# Patient Record
Sex: Male | Born: 1935
Health system: Southern US, Community
[De-identification: ages and names within clinical notes are randomized; demographics above are authoritative.]

## PROBLEM LIST (undated history)

## (undated) DIAGNOSIS — N5201 Erectile dysfunction due to arterial insufficiency: Secondary | ICD-10-CM

## (undated) DIAGNOSIS — Z7901 Long term (current) use of anticoagulants: Secondary | ICD-10-CM

## (undated) DIAGNOSIS — N402 Nodular prostate without lower urinary tract symptoms: Secondary | ICD-10-CM

## (undated) DIAGNOSIS — R1319 Other dysphagia: Secondary | ICD-10-CM

## (undated) DIAGNOSIS — I480 Paroxysmal atrial fibrillation: Secondary | ICD-10-CM

## (undated) DIAGNOSIS — E039 Hypothyroidism, unspecified: Secondary | ICD-10-CM

## (undated) DIAGNOSIS — M5412 Radiculopathy, cervical region: Secondary | ICD-10-CM

## (undated) DIAGNOSIS — R1310 Dysphagia, unspecified: Secondary | ICD-10-CM

## (undated) DIAGNOSIS — K219 Gastro-esophageal reflux disease without esophagitis: Secondary | ICD-10-CM

## (undated) DIAGNOSIS — G5602 Carpal tunnel syndrome, left upper limb: Secondary | ICD-10-CM

## (undated) DIAGNOSIS — I10 Essential (primary) hypertension: Secondary | ICD-10-CM

## (undated) DIAGNOSIS — M5430 Sciatica, unspecified side: Secondary | ICD-10-CM

## (undated) DIAGNOSIS — M1990 Unspecified osteoarthritis, unspecified site: Secondary | ICD-10-CM

## (undated) DIAGNOSIS — A809 Acute poliomyelitis, unspecified: Secondary | ICD-10-CM

## (undated) HISTORY — DX: Paroxysmal atrial fibrillation: I48.0

## (undated) HISTORY — DX: Unspecified osteoarthritis, unspecified site: M19.90

## (undated) HISTORY — DX: Radiculopathy, cervical region: M54.12

## (undated) HISTORY — DX: Sciatica, unspecified side: M54.30

## (undated) HISTORY — PX: STAPEDES SURGERY: SHX789

## (undated) HISTORY — DX: Nodular prostate without lower urinary tract symptoms: N40.2

## (undated) HISTORY — PX: VASECTOMY: SHX75

## (undated) HISTORY — DX: Acute poliomyelitis, unspecified: A80.9

## (undated) HISTORY — DX: Erectile dysfunction due to arterial insufficiency: N52.01

## (undated) HISTORY — DX: Gastro-esophageal reflux disease without esophagitis: K21.9

## (undated) HISTORY — DX: Other dysphagia: R13.19

## (undated) HISTORY — DX: Carpal tunnel syndrome, left upper limb: G56.02

## (undated) HISTORY — DX: Hypothyroidism, unspecified: E03.9

## (undated) HISTORY — DX: Long term (current) use of anticoagulants: Z79.01

## (undated) HISTORY — PX: KNEE SURGERY: SHX244

## (undated) HISTORY — DX: Essential (primary) hypertension: I10

## (undated) HISTORY — PX: BACK SURGERY: SHX140

---
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
## 1997-09-14 HISTORY — PX: FLEXIBLE SIGMOIDOSCOPY: SHX1649

## 2010-12-08 HISTORY — PX: COLONOSCOPY: SHX174

## 2014-08-16 DIAGNOSIS — M5412 Radiculopathy, cervical region: Secondary | ICD-10-CM

## 2014-08-16 HISTORY — DX: Radiculopathy, cervical region: M54.12

## 2014-08-28 DIAGNOSIS — G5602 Carpal tunnel syndrome, left upper limb: Secondary | ICD-10-CM

## 2014-08-28 HISTORY — DX: Carpal tunnel syndrome, left upper limb: G56.02

## 2015-06-25 DIAGNOSIS — K210 Gastro-esophageal reflux disease with esophagitis: Secondary | ICD-10-CM | POA: Diagnosis not present

## 2015-06-25 DIAGNOSIS — R1314 Dysphagia, pharyngoesophageal phase: Secondary | ICD-10-CM | POA: Diagnosis not present

## 2015-06-27 DIAGNOSIS — R1310 Dysphagia, unspecified: Secondary | ICD-10-CM | POA: Diagnosis not present

## 2015-06-27 DIAGNOSIS — K219 Gastro-esophageal reflux disease without esophagitis: Secondary | ICD-10-CM | POA: Diagnosis not present

## 2015-07-01 DIAGNOSIS — M79642 Pain in left hand: Secondary | ICD-10-CM | POA: Diagnosis not present

## 2015-07-01 DIAGNOSIS — G5602 Carpal tunnel syndrome, left upper limb: Secondary | ICD-10-CM | POA: Diagnosis not present

## 2015-07-08 DIAGNOSIS — M1990 Unspecified osteoarthritis, unspecified site: Secondary | ICD-10-CM | POA: Diagnosis not present

## 2015-07-08 DIAGNOSIS — I10 Essential (primary) hypertension: Secondary | ICD-10-CM | POA: Diagnosis not present

## 2015-07-08 DIAGNOSIS — Z8612 Personal history of poliomyelitis: Secondary | ICD-10-CM | POA: Diagnosis not present

## 2015-07-08 DIAGNOSIS — R1314 Dysphagia, pharyngoesophageal phase: Secondary | ICD-10-CM | POA: Diagnosis not present

## 2015-07-08 DIAGNOSIS — E039 Hypothyroidism, unspecified: Secondary | ICD-10-CM | POA: Diagnosis not present

## 2015-07-08 DIAGNOSIS — K210 Gastro-esophageal reflux disease with esophagitis: Secondary | ICD-10-CM | POA: Diagnosis not present

## 2015-07-08 DIAGNOSIS — K222 Esophageal obstruction: Secondary | ICD-10-CM | POA: Diagnosis not present

## 2015-07-08 DIAGNOSIS — M5430 Sciatica, unspecified side: Secondary | ICD-10-CM | POA: Diagnosis not present

## 2015-07-08 DIAGNOSIS — K449 Diaphragmatic hernia without obstruction or gangrene: Secondary | ICD-10-CM | POA: Diagnosis not present

## 2015-07-08 HISTORY — PX: ESOPHAGOGASTRODUODENOSCOPY: SHX1529

## 2015-07-12 DIAGNOSIS — N402 Nodular prostate without lower urinary tract symptoms: Secondary | ICD-10-CM | POA: Diagnosis not present

## 2015-07-12 DIAGNOSIS — R5383 Other fatigue: Secondary | ICD-10-CM | POA: Diagnosis not present

## 2015-07-12 DIAGNOSIS — Z79899 Other long term (current) drug therapy: Secondary | ICD-10-CM | POA: Diagnosis not present

## 2015-07-12 DIAGNOSIS — D509 Iron deficiency anemia, unspecified: Secondary | ICD-10-CM | POA: Diagnosis not present

## 2015-07-12 DIAGNOSIS — Z125 Encounter for screening for malignant neoplasm of prostate: Secondary | ICD-10-CM | POA: Diagnosis not present

## 2015-07-12 DIAGNOSIS — Z23 Encounter for immunization: Secondary | ICD-10-CM | POA: Diagnosis not present

## 2015-07-17 DIAGNOSIS — R1310 Dysphagia, unspecified: Secondary | ICD-10-CM | POA: Diagnosis not present

## 2015-07-24 DIAGNOSIS — N402 Nodular prostate without lower urinary tract symptoms: Secondary | ICD-10-CM

## 2015-07-24 DIAGNOSIS — N5201 Erectile dysfunction due to arterial insufficiency: Secondary | ICD-10-CM

## 2015-07-24 HISTORY — DX: Erectile dysfunction due to arterial insufficiency: N52.01

## 2015-07-24 HISTORY — DX: Nodular prostate without lower urinary tract symptoms: N40.2

## 2015-08-13 DIAGNOSIS — R69 Illness, unspecified: Secondary | ICD-10-CM | POA: Diagnosis not present

## 2015-08-26 DIAGNOSIS — G5602 Carpal tunnel syndrome, left upper limb: Secondary | ICD-10-CM | POA: Diagnosis not present

## 2015-09-02 DIAGNOSIS — G5602 Carpal tunnel syndrome, left upper limb: Secondary | ICD-10-CM | POA: Diagnosis not present

## 2015-09-05 DIAGNOSIS — Z0100 Encounter for examination of eyes and vision without abnormal findings: Secondary | ICD-10-CM | POA: Diagnosis not present

## 2015-09-05 DIAGNOSIS — H524 Presbyopia: Secondary | ICD-10-CM | POA: Diagnosis not present

## 2015-09-05 DIAGNOSIS — H35373 Puckering of macula, bilateral: Secondary | ICD-10-CM | POA: Diagnosis not present

## 2015-09-05 DIAGNOSIS — H353131 Nonexudative age-related macular degeneration, bilateral, early dry stage: Secondary | ICD-10-CM | POA: Diagnosis not present

## 2015-09-05 DIAGNOSIS — H5203 Hypermetropia, bilateral: Secondary | ICD-10-CM | POA: Diagnosis not present

## 2015-09-05 DIAGNOSIS — H25813 Combined forms of age-related cataract, bilateral: Secondary | ICD-10-CM | POA: Diagnosis not present

## 2015-09-05 DIAGNOSIS — H52223 Regular astigmatism, bilateral: Secondary | ICD-10-CM | POA: Diagnosis not present

## 2015-10-02 DIAGNOSIS — G5602 Carpal tunnel syndrome, left upper limb: Secondary | ICD-10-CM | POA: Diagnosis not present

## 2015-10-02 DIAGNOSIS — M9901 Segmental and somatic dysfunction of cervical region: Secondary | ICD-10-CM | POA: Diagnosis not present

## 2015-10-02 DIAGNOSIS — S161XXA Strain of muscle, fascia and tendon at neck level, initial encounter: Secondary | ICD-10-CM | POA: Diagnosis not present

## 2015-10-03 DIAGNOSIS — M9901 Segmental and somatic dysfunction of cervical region: Secondary | ICD-10-CM | POA: Diagnosis not present

## 2015-10-03 DIAGNOSIS — G5602 Carpal tunnel syndrome, left upper limb: Secondary | ICD-10-CM | POA: Diagnosis not present

## 2015-10-03 DIAGNOSIS — S161XXA Strain of muscle, fascia and tendon at neck level, initial encounter: Secondary | ICD-10-CM | POA: Diagnosis not present

## 2015-10-08 DIAGNOSIS — H353131 Nonexudative age-related macular degeneration, bilateral, early dry stage: Secondary | ICD-10-CM | POA: Diagnosis not present

## 2015-10-09 DIAGNOSIS — G5602 Carpal tunnel syndrome, left upper limb: Secondary | ICD-10-CM | POA: Diagnosis not present

## 2015-10-09 DIAGNOSIS — S161XXA Strain of muscle, fascia and tendon at neck level, initial encounter: Secondary | ICD-10-CM | POA: Diagnosis not present

## 2015-10-09 DIAGNOSIS — M9901 Segmental and somatic dysfunction of cervical region: Secondary | ICD-10-CM | POA: Diagnosis not present

## 2015-10-10 DIAGNOSIS — M9901 Segmental and somatic dysfunction of cervical region: Secondary | ICD-10-CM | POA: Diagnosis not present

## 2015-10-10 DIAGNOSIS — G5602 Carpal tunnel syndrome, left upper limb: Secondary | ICD-10-CM | POA: Diagnosis not present

## 2015-10-10 DIAGNOSIS — S161XXA Strain of muscle, fascia and tendon at neck level, initial encounter: Secondary | ICD-10-CM | POA: Diagnosis not present

## 2015-10-14 DIAGNOSIS — S161XXA Strain of muscle, fascia and tendon at neck level, initial encounter: Secondary | ICD-10-CM | POA: Diagnosis not present

## 2015-10-14 DIAGNOSIS — M9901 Segmental and somatic dysfunction of cervical region: Secondary | ICD-10-CM | POA: Diagnosis not present

## 2015-10-14 DIAGNOSIS — G5602 Carpal tunnel syndrome, left upper limb: Secondary | ICD-10-CM | POA: Diagnosis not present

## 2015-10-17 DIAGNOSIS — S161XXA Strain of muscle, fascia and tendon at neck level, initial encounter: Secondary | ICD-10-CM | POA: Diagnosis not present

## 2015-10-17 DIAGNOSIS — G5602 Carpal tunnel syndrome, left upper limb: Secondary | ICD-10-CM | POA: Diagnosis not present

## 2015-10-17 DIAGNOSIS — M9901 Segmental and somatic dysfunction of cervical region: Secondary | ICD-10-CM | POA: Diagnosis not present

## 2015-10-21 DIAGNOSIS — M9901 Segmental and somatic dysfunction of cervical region: Secondary | ICD-10-CM | POA: Diagnosis not present

## 2015-10-21 DIAGNOSIS — S161XXA Strain of muscle, fascia and tendon at neck level, initial encounter: Secondary | ICD-10-CM | POA: Diagnosis not present

## 2015-10-21 DIAGNOSIS — G5602 Carpal tunnel syndrome, left upper limb: Secondary | ICD-10-CM | POA: Diagnosis not present

## 2015-10-24 DIAGNOSIS — S161XXA Strain of muscle, fascia and tendon at neck level, initial encounter: Secondary | ICD-10-CM | POA: Diagnosis not present

## 2015-10-24 DIAGNOSIS — M9901 Segmental and somatic dysfunction of cervical region: Secondary | ICD-10-CM | POA: Diagnosis not present

## 2015-10-24 DIAGNOSIS — G5602 Carpal tunnel syndrome, left upper limb: Secondary | ICD-10-CM | POA: Diagnosis not present

## 2015-11-01 DIAGNOSIS — R69 Illness, unspecified: Secondary | ICD-10-CM | POA: Diagnosis not present

## 2015-11-07 DIAGNOSIS — M9901 Segmental and somatic dysfunction of cervical region: Secondary | ICD-10-CM | POA: Diagnosis not present

## 2015-11-07 DIAGNOSIS — S161XXA Strain of muscle, fascia and tendon at neck level, initial encounter: Secondary | ICD-10-CM | POA: Diagnosis not present

## 2015-11-07 DIAGNOSIS — G5602 Carpal tunnel syndrome, left upper limb: Secondary | ICD-10-CM | POA: Diagnosis not present

## 2015-11-28 DIAGNOSIS — M9901 Segmental and somatic dysfunction of cervical region: Secondary | ICD-10-CM | POA: Diagnosis not present

## 2015-11-28 DIAGNOSIS — S161XXA Strain of muscle, fascia and tendon at neck level, initial encounter: Secondary | ICD-10-CM | POA: Diagnosis not present

## 2015-11-28 DIAGNOSIS — G5602 Carpal tunnel syndrome, left upper limb: Secondary | ICD-10-CM | POA: Diagnosis not present

## 2015-12-05 DIAGNOSIS — S161XXA Strain of muscle, fascia and tendon at neck level, initial encounter: Secondary | ICD-10-CM | POA: Diagnosis not present

## 2015-12-05 DIAGNOSIS — G5602 Carpal tunnel syndrome, left upper limb: Secondary | ICD-10-CM | POA: Diagnosis not present

## 2015-12-05 DIAGNOSIS — M9901 Segmental and somatic dysfunction of cervical region: Secondary | ICD-10-CM | POA: Diagnosis not present

## 2015-12-18 DIAGNOSIS — G5602 Carpal tunnel syndrome, left upper limb: Secondary | ICD-10-CM | POA: Diagnosis not present

## 2016-03-05 DIAGNOSIS — H353131 Nonexudative age-related macular degeneration, bilateral, early dry stage: Secondary | ICD-10-CM | POA: Diagnosis not present

## 2016-04-15 DIAGNOSIS — M79642 Pain in left hand: Secondary | ICD-10-CM | POA: Diagnosis not present

## 2016-04-15 DIAGNOSIS — G5602 Carpal tunnel syndrome, left upper limb: Secondary | ICD-10-CM | POA: Diagnosis not present

## 2016-07-01 DIAGNOSIS — M79642 Pain in left hand: Secondary | ICD-10-CM | POA: Diagnosis not present

## 2016-07-01 DIAGNOSIS — G5602 Carpal tunnel syndrome, left upper limb: Secondary | ICD-10-CM | POA: Diagnosis not present

## 2016-07-15 HISTORY — PX: WRIST SURGERY: SHX841

## 2016-07-17 DIAGNOSIS — Z23 Encounter for immunization: Secondary | ICD-10-CM | POA: Diagnosis not present

## 2016-07-23 DIAGNOSIS — G5602 Carpal tunnel syndrome, left upper limb: Secondary | ICD-10-CM | POA: Diagnosis not present

## 2016-07-30 DIAGNOSIS — G5602 Carpal tunnel syndrome, left upper limb: Secondary | ICD-10-CM | POA: Diagnosis not present

## 2016-07-30 DIAGNOSIS — Z4789 Encounter for other orthopedic aftercare: Secondary | ICD-10-CM | POA: Diagnosis not present

## 2016-08-04 DIAGNOSIS — G5602 Carpal tunnel syndrome, left upper limb: Secondary | ICD-10-CM | POA: Diagnosis not present

## 2016-08-20 DIAGNOSIS — N402 Nodular prostate without lower urinary tract symptoms: Secondary | ICD-10-CM | POA: Diagnosis not present

## 2016-08-20 DIAGNOSIS — N5201 Erectile dysfunction due to arterial insufficiency: Secondary | ICD-10-CM | POA: Diagnosis not present

## 2016-08-28 DIAGNOSIS — E782 Mixed hyperlipidemia: Secondary | ICD-10-CM | POA: Diagnosis not present

## 2016-08-28 DIAGNOSIS — Z0000 Encounter for general adult medical examination without abnormal findings: Secondary | ICD-10-CM | POA: Diagnosis not present

## 2016-08-28 DIAGNOSIS — Z125 Encounter for screening for malignant neoplasm of prostate: Secondary | ICD-10-CM | POA: Diagnosis not present

## 2016-08-28 DIAGNOSIS — E039 Hypothyroidism, unspecified: Secondary | ICD-10-CM | POA: Diagnosis not present

## 2016-08-28 DIAGNOSIS — Z1389 Encounter for screening for other disorder: Secondary | ICD-10-CM | POA: Diagnosis not present

## 2016-08-28 DIAGNOSIS — Z9181 History of falling: Secondary | ICD-10-CM | POA: Diagnosis not present

## 2016-08-28 DIAGNOSIS — Z79899 Other long term (current) drug therapy: Secondary | ICD-10-CM | POA: Diagnosis not present

## 2016-08-31 DIAGNOSIS — G5602 Carpal tunnel syndrome, left upper limb: Secondary | ICD-10-CM | POA: Diagnosis not present

## 2016-10-06 DIAGNOSIS — M1711 Unilateral primary osteoarthritis, right knee: Secondary | ICD-10-CM | POA: Diagnosis not present

## 2016-10-06 DIAGNOSIS — M25461 Effusion, right knee: Secondary | ICD-10-CM | POA: Diagnosis not present

## 2016-10-13 DIAGNOSIS — H353131 Nonexudative age-related macular degeneration, bilateral, early dry stage: Secondary | ICD-10-CM | POA: Diagnosis not present

## 2016-10-16 DIAGNOSIS — R69 Illness, unspecified: Secondary | ICD-10-CM | POA: Diagnosis not present

## 2016-10-27 DIAGNOSIS — M1712 Unilateral primary osteoarthritis, left knee: Secondary | ICD-10-CM | POA: Diagnosis not present

## 2016-10-30 ENCOUNTER — Ambulatory Visit: Payer: Self-pay | Admitting: Sports Medicine

## 2016-10-30 ENCOUNTER — Ambulatory Visit (INDEPENDENT_AMBULATORY_CARE_PROVIDER_SITE_OTHER): Payer: Medicare HMO

## 2016-10-30 DIAGNOSIS — M79671 Pain in right foot: Secondary | ICD-10-CM

## 2016-10-30 DIAGNOSIS — M216X2 Other acquired deformities of left foot: Secondary | ICD-10-CM

## 2016-10-30 DIAGNOSIS — A809 Acute poliomyelitis, unspecified: Secondary | ICD-10-CM

## 2016-10-30 DIAGNOSIS — M79672 Pain in left foot: Secondary | ICD-10-CM

## 2016-10-30 DIAGNOSIS — I739 Peripheral vascular disease, unspecified: Secondary | ICD-10-CM

## 2016-10-30 DIAGNOSIS — L97521 Non-pressure chronic ulcer of other part of left foot limited to breakdown of skin: Secondary | ICD-10-CM

## 2016-10-30 NOTE — Progress Notes (Addendum)
Subjective: Terry Villanueva is a 81 y.o. male patient seen in office for evaluation of ulceration of the left foot that started after trimming callus. Patient has a history of  Polio as a child with it affecting the left leg. Patient is changing the dressing using antibiotic cream at home. Denies nausea/fever/vomiting/chills/night sweats/shortness of breath/pain. Patient has no other pedal complaints at this time.  Patient Active Problem List   Diagnosis Date Noted  . Erectile dysfunction due to arterial insufficiency 07/24/2015  . Prostate nodule 07/24/2015  . Carpal tunnel syndrome of left wrist 08/28/2014  . Cervical radiculopathy 08/16/2014   No current outpatient prescriptions on file prior to visit.   No current facility-administered medications on file prior to visit.    Not on File  No results found for this or any previous visit (from the past 2160 hour(s)).  Objective: There were no vitals filed for this visit.  General: Patient is awake, alert, oriented x 3 and in no acute distress.  Dermatology: Skin is warm and dry bilateral with a partial thickness ulceration present sub met 5 on left. Ulceration measures 0.3 cm x 0.3 cm x 0.1 cm. There is a keratotic border with a granular base. The ulceration does not probe to bone. There is no malodor, no active drainage, no erythema, no edema. No acute signs of infection.   Vascular: Dorsalis Pedis pulse = 1/4 Bilateral,  Posterior Tibial pulse = 1/4 Bilateral,  Capillary Fill Time < 5 seconds, Hyperpigmentation to legs with trace edema.   Neurologic: Mild tenderness to ulceration site on left. Equinovarus foot on left with enlarged 5th met base  Xrays, Left/right foot: General arthritis with enlarged 5th met styloid. No bony destruction suggestive of osteomyelitis. No gas in soft tissues.   Assessment and Plan:  Problem List Items Addressed This Visit    None    Visit Diagnoses    Left foot pain    -  Primary   Relevant Orders   DG Foot 2 Views Left   DG Foot 2 Views Right   Skin ulcer of left foot, limited to breakdown of skin ([place])       sub met 5   PVD (peripheral vascular disease) ([facility])       Relevant Medications   ASPIRIN 81 PO   sildenafil (REVATIO) 20 MG tablet   Polio       Acquired equinus deformity of left foot         -Examined patient and discussed the progression of the wound and treatment alternatives. -Xrays reviewed - Excisionally dedbrided ulceration to healthy bleeding borders using a sterile15  blade. -Applied offloading pad and antibiotic cream with bandaid and instructed patient to continue with daily dressings at home consisting of the same -Add heel wedge to shoe liner -Encouraged elevation when sitting for edema control/PVD - Advised patient to go to the ER or return to office if the wound worsens or if constitutional symptoms are present. -Patient to return to office in 2 weeks for follow up care and evaluation or sooner if problems arise.  Landis Martins, DPM

## 2016-11-13 ENCOUNTER — Ambulatory Visit: Payer: Medicare HMO | Admitting: Sports Medicine

## 2016-11-18 ENCOUNTER — Encounter: Payer: Self-pay | Admitting: Sports Medicine

## 2016-11-18 ENCOUNTER — Ambulatory Visit (INDEPENDENT_AMBULATORY_CARE_PROVIDER_SITE_OTHER): Payer: Medicare HMO | Admitting: Sports Medicine

## 2016-11-18 DIAGNOSIS — A809 Acute poliomyelitis, unspecified: Secondary | ICD-10-CM

## 2016-11-18 DIAGNOSIS — M79672 Pain in left foot: Secondary | ICD-10-CM | POA: Diagnosis not present

## 2016-11-18 DIAGNOSIS — L97521 Non-pressure chronic ulcer of other part of left foot limited to breakdown of skin: Secondary | ICD-10-CM | POA: Diagnosis not present

## 2016-11-18 DIAGNOSIS — I739 Peripheral vascular disease, unspecified: Secondary | ICD-10-CM | POA: Diagnosis not present

## 2016-11-18 NOTE — Progress Notes (Signed)
Subjective: Terry Villanueva is a 81 y.o. male patient seen in office for follow-up evaluation of ulceration of the left foot; Patient is changing the dressing using antibiotic cream and a offloading pad at home. Denies nausea/fever/vomiting/chills/night sweats/shortness of breath/pain. Patient has no other pedal complaints at this time.  Patient Active Problem List   Diagnosis Date Noted  . Erectile dysfunction due to arterial insufficiency 07/24/2015  . Prostate nodule 07/24/2015  . Carpal tunnel syndrome of left wrist 08/28/2014  . Cervical radiculopathy 08/16/2014   Current Outpatient Prescriptions on File Prior to Visit  Medication Sig Dispense Refill  . Ascorbic Acid (VITAMIN C) 1000 MG tablet Take 1,000 mg by mouth.    . ASPIRIN 81 PO Take by mouth.    . Flaxseed, Linseed, (FLAXSEED OIL MAX STR) 1300 MG CAPS Take by mouth.    . levothyroxine (SYNTHROID, LEVOTHROID) 50 MCG tablet Take by mouth.    . levothyroxine (SYNTHROID, LEVOTHROID) 50 MCG tablet     . omeprazole (PRILOSEC) 20 MG capsule     . Pyridoxine HCl (VITAMIN B-6 CR) 200 MG TBCR Take by mouth.    . sildenafil (REVATIO) 20 MG tablet 2-5 tab po 1-2 hours before prn     No current facility-administered medications on file prior to visit.    Not on File  No results found for this or any previous visit (from the past 2160 hour(s)).  Objective: There were no vitals filed for this visit.  General: Patient is awake, alert, oriented x 3 and in no acute distress.  Dermatology: Skin is warm and dry bilateral with a Healed ulceration sub met 5 on left with now reactive callus tissue. There is no malodor, no active drainage, no erythema, no edema. No acute signs of infection.   Vascular: Dorsalis Pedis pulse = 1/4 Bilateral,  Posterior Tibial pulse = 1/4 Bilateral,  Capillary Fill Time < 5 seconds, Hyperpigmentation to legs with trace edema.   Neurologic: Mild tenderness to ulceration site on left. Equinovarus foot on left with  enlarged 5th met base  Assessment and Plan:  Problem List Items Addressed This Visit    None    Visit Diagnoses    Left foot pain    -  Primary   Skin ulcer of left foot, limited to breakdown of skin ([place])       healed now callus   PVD (peripheral vascular disease) ([place])       Polio         -Examined patient and discussed the progression of the Healed wound with now reactive callus tissue - Dedbrided callus sub-met 5 on left with sterile chisel blade with no underlying opening revealed -Applied offloading pad; patient to continue with this 1 month to prevent reoccurrence of ulceration -Continue with good supportive shoes and inserts with offloading padding to prevent reoccurrence of ulceration -Encouraged elevation when sitting for edema control/PVD -Patient to return to office in as needed or sooner if problems arise.  Landis Martins, DPM

## 2016-11-29 DIAGNOSIS — Z7982 Long term (current) use of aspirin: Secondary | ICD-10-CM | POA: Diagnosis not present

## 2016-11-29 DIAGNOSIS — R7989 Other specified abnormal findings of blood chemistry: Secondary | ICD-10-CM | POA: Diagnosis not present

## 2016-11-29 DIAGNOSIS — I4891 Unspecified atrial fibrillation: Secondary | ICD-10-CM | POA: Diagnosis not present

## 2016-11-29 DIAGNOSIS — I10 Essential (primary) hypertension: Secondary | ICD-10-CM | POA: Diagnosis not present

## 2016-11-29 DIAGNOSIS — I249 Acute ischemic heart disease, unspecified: Secondary | ICD-10-CM | POA: Diagnosis not present

## 2016-11-29 DIAGNOSIS — M1990 Unspecified osteoarthritis, unspecified site: Secondary | ICD-10-CM | POA: Diagnosis not present

## 2016-11-29 DIAGNOSIS — K219 Gastro-esophageal reflux disease without esophagitis: Secondary | ICD-10-CM | POA: Diagnosis not present

## 2016-11-29 DIAGNOSIS — E039 Hypothyroidism, unspecified: Secondary | ICD-10-CM | POA: Diagnosis not present

## 2016-11-29 DIAGNOSIS — I499 Cardiac arrhythmia, unspecified: Secondary | ICD-10-CM | POA: Diagnosis not present

## 2016-11-29 DIAGNOSIS — I480 Paroxysmal atrial fibrillation: Secondary | ICD-10-CM | POA: Diagnosis not present

## 2016-11-29 DIAGNOSIS — R001 Bradycardia, unspecified: Secondary | ICD-10-CM | POA: Diagnosis not present

## 2016-11-29 DIAGNOSIS — R778 Other specified abnormalities of plasma proteins: Secondary | ICD-10-CM | POA: Diagnosis not present

## 2016-11-29 DIAGNOSIS — R748 Abnormal levels of other serum enzymes: Secondary | ICD-10-CM | POA: Diagnosis not present

## 2016-11-29 DIAGNOSIS — R0789 Other chest pain: Secondary | ICD-10-CM | POA: Diagnosis not present

## 2016-11-30 DIAGNOSIS — M1990 Unspecified osteoarthritis, unspecified site: Secondary | ICD-10-CM | POA: Diagnosis not present

## 2016-11-30 DIAGNOSIS — Z7901 Long term (current) use of anticoagulants: Secondary | ICD-10-CM | POA: Diagnosis not present

## 2016-11-30 DIAGNOSIS — I480 Paroxysmal atrial fibrillation: Secondary | ICD-10-CM | POA: Diagnosis not present

## 2016-11-30 DIAGNOSIS — R748 Abnormal levels of other serum enzymes: Secondary | ICD-10-CM | POA: Diagnosis not present

## 2016-11-30 DIAGNOSIS — I4891 Unspecified atrial fibrillation: Secondary | ICD-10-CM | POA: Diagnosis not present

## 2016-11-30 DIAGNOSIS — E039 Hypothyroidism, unspecified: Secondary | ICD-10-CM | POA: Diagnosis not present

## 2016-11-30 DIAGNOSIS — I340 Nonrheumatic mitral (valve) insufficiency: Secondary | ICD-10-CM | POA: Diagnosis not present

## 2016-11-30 DIAGNOSIS — I361 Nonrheumatic tricuspid (valve) insufficiency: Secondary | ICD-10-CM | POA: Diagnosis not present

## 2016-12-02 DIAGNOSIS — M25512 Pain in left shoulder: Secondary | ICD-10-CM | POA: Diagnosis not present

## 2016-12-09 DIAGNOSIS — I4891 Unspecified atrial fibrillation: Secondary | ICD-10-CM | POA: Diagnosis not present

## 2016-12-09 DIAGNOSIS — Z6824 Body mass index (BMI) 24.0-24.9, adult: Secondary | ICD-10-CM | POA: Diagnosis not present

## 2016-12-19 DIAGNOSIS — Z7901 Long term (current) use of anticoagulants: Secondary | ICD-10-CM

## 2016-12-19 HISTORY — DX: Long term (current) use of anticoagulants: Z79.01

## 2016-12-21 DIAGNOSIS — R748 Abnormal levels of other serum enzymes: Secondary | ICD-10-CM | POA: Diagnosis not present

## 2016-12-21 DIAGNOSIS — I480 Paroxysmal atrial fibrillation: Secondary | ICD-10-CM | POA: Diagnosis not present

## 2016-12-21 DIAGNOSIS — Z7901 Long term (current) use of anticoagulants: Secondary | ICD-10-CM | POA: Diagnosis not present

## 2016-12-29 DIAGNOSIS — S0101XA Laceration without foreign body of scalp, initial encounter: Secondary | ICD-10-CM | POA: Diagnosis not present

## 2016-12-29 DIAGNOSIS — S0990XA Unspecified injury of head, initial encounter: Secondary | ICD-10-CM | POA: Diagnosis not present

## 2016-12-29 DIAGNOSIS — S51011A Laceration without foreign body of right elbow, initial encounter: Secondary | ICD-10-CM | POA: Diagnosis not present

## 2016-12-29 DIAGNOSIS — S199XXA Unspecified injury of neck, initial encounter: Secondary | ICD-10-CM | POA: Diagnosis not present

## 2016-12-29 DIAGNOSIS — S0191XA Laceration without foreign body of unspecified part of head, initial encounter: Secondary | ICD-10-CM | POA: Diagnosis not present

## 2016-12-29 DIAGNOSIS — S6992XA Unspecified injury of left wrist, hand and finger(s), initial encounter: Secondary | ICD-10-CM | POA: Diagnosis not present

## 2016-12-29 DIAGNOSIS — S098XXA Other specified injuries of head, initial encounter: Secondary | ICD-10-CM | POA: Diagnosis not present

## 2016-12-29 DIAGNOSIS — W010XXA Fall on same level from slipping, tripping and stumbling without subsequent striking against object, initial encounter: Secondary | ICD-10-CM | POA: Diagnosis not present

## 2016-12-29 DIAGNOSIS — S61215A Laceration without foreign body of left ring finger without damage to nail, initial encounter: Secondary | ICD-10-CM | POA: Diagnosis not present

## 2016-12-29 DIAGNOSIS — S61412A Laceration without foreign body of left hand, initial encounter: Secondary | ICD-10-CM | POA: Diagnosis not present

## 2016-12-30 DIAGNOSIS — S0990XA Unspecified injury of head, initial encounter: Secondary | ICD-10-CM | POA: Diagnosis not present

## 2017-01-06 DIAGNOSIS — L570 Actinic keratosis: Secondary | ICD-10-CM | POA: Diagnosis not present

## 2017-01-06 DIAGNOSIS — L578 Other skin changes due to chronic exposure to nonionizing radiation: Secondary | ICD-10-CM | POA: Diagnosis not present

## 2017-02-02 DIAGNOSIS — M1711 Unilateral primary osteoarthritis, right knee: Secondary | ICD-10-CM | POA: Diagnosis not present

## 2017-02-02 DIAGNOSIS — M1712 Unilateral primary osteoarthritis, left knee: Secondary | ICD-10-CM | POA: Diagnosis not present

## 2017-05-05 DIAGNOSIS — B079 Viral wart, unspecified: Secondary | ICD-10-CM | POA: Diagnosis not present

## 2017-05-05 DIAGNOSIS — L578 Other skin changes due to chronic exposure to nonionizing radiation: Secondary | ICD-10-CM | POA: Diagnosis not present

## 2017-05-05 DIAGNOSIS — L821 Other seborrheic keratosis: Secondary | ICD-10-CM | POA: Diagnosis not present

## 2017-05-12 DIAGNOSIS — M25512 Pain in left shoulder: Secondary | ICD-10-CM | POA: Diagnosis not present

## 2017-05-12 DIAGNOSIS — G8929 Other chronic pain: Secondary | ICD-10-CM | POA: Diagnosis not present

## 2017-05-27 DIAGNOSIS — M1711 Unilateral primary osteoarthritis, right knee: Secondary | ICD-10-CM | POA: Diagnosis not present

## 2017-05-27 DIAGNOSIS — M1712 Unilateral primary osteoarthritis, left knee: Secondary | ICD-10-CM | POA: Diagnosis not present

## 2017-06-07 DIAGNOSIS — I10 Essential (primary) hypertension: Secondary | ICD-10-CM

## 2017-06-07 DIAGNOSIS — I480 Paroxysmal atrial fibrillation: Secondary | ICD-10-CM | POA: Insufficient documentation

## 2017-06-07 DIAGNOSIS — Z7901 Long term (current) use of anticoagulants: Secondary | ICD-10-CM | POA: Diagnosis not present

## 2017-06-07 DIAGNOSIS — I119 Hypertensive heart disease without heart failure: Secondary | ICD-10-CM | POA: Insufficient documentation

## 2017-06-07 HISTORY — DX: Essential (primary) hypertension: I10

## 2017-06-07 HISTORY — DX: Paroxysmal atrial fibrillation: I48.0

## 2017-06-07 HISTORY — DX: Hypertensive heart disease without heart failure: I11.9

## 2017-06-30 DIAGNOSIS — Z23 Encounter for immunization: Secondary | ICD-10-CM | POA: Diagnosis not present

## 2017-06-30 DIAGNOSIS — K439 Ventral hernia without obstruction or gangrene: Secondary | ICD-10-CM | POA: Diagnosis not present

## 2017-06-30 DIAGNOSIS — Z1339 Encounter for screening examination for other mental health and behavioral disorders: Secondary | ICD-10-CM | POA: Diagnosis not present

## 2017-06-30 DIAGNOSIS — Z6824 Body mass index (BMI) 24.0-24.9, adult: Secondary | ICD-10-CM | POA: Diagnosis not present

## 2017-08-26 DIAGNOSIS — M25512 Pain in left shoulder: Secondary | ICD-10-CM | POA: Diagnosis not present

## 2017-08-26 DIAGNOSIS — G8929 Other chronic pain: Secondary | ICD-10-CM | POA: Diagnosis not present

## 2017-09-03 ENCOUNTER — Encounter: Payer: Self-pay | Admitting: Cardiology

## 2017-09-03 DIAGNOSIS — Z0000 Encounter for general adult medical examination without abnormal findings: Secondary | ICD-10-CM | POA: Diagnosis not present

## 2017-09-03 DIAGNOSIS — Z79899 Other long term (current) drug therapy: Secondary | ICD-10-CM | POA: Diagnosis not present

## 2017-09-03 DIAGNOSIS — Z13820 Encounter for screening for osteoporosis: Secondary | ICD-10-CM | POA: Diagnosis not present

## 2017-09-03 DIAGNOSIS — Z1339 Encounter for screening examination for other mental health and behavioral disorders: Secondary | ICD-10-CM | POA: Diagnosis not present

## 2017-09-03 DIAGNOSIS — Z1331 Encounter for screening for depression: Secondary | ICD-10-CM | POA: Diagnosis not present

## 2017-09-03 DIAGNOSIS — E782 Mixed hyperlipidemia: Secondary | ICD-10-CM | POA: Diagnosis not present

## 2017-09-03 DIAGNOSIS — D519 Vitamin B12 deficiency anemia, unspecified: Secondary | ICD-10-CM | POA: Diagnosis not present

## 2017-09-03 DIAGNOSIS — Z125 Encounter for screening for malignant neoplasm of prostate: Secondary | ICD-10-CM | POA: Diagnosis not present

## 2017-09-03 DIAGNOSIS — Z9181 History of falling: Secondary | ICD-10-CM | POA: Diagnosis not present

## 2017-09-03 DIAGNOSIS — M859 Disorder of bone density and structure, unspecified: Secondary | ICD-10-CM | POA: Diagnosis not present

## 2017-09-03 DIAGNOSIS — Z6824 Body mass index (BMI) 24.0-24.9, adult: Secondary | ICD-10-CM | POA: Diagnosis not present

## 2017-09-13 DIAGNOSIS — M1711 Unilateral primary osteoarthritis, right knee: Secondary | ICD-10-CM | POA: Diagnosis not present

## 2017-09-13 DIAGNOSIS — M1712 Unilateral primary osteoarthritis, left knee: Secondary | ICD-10-CM | POA: Diagnosis not present

## 2017-10-19 DIAGNOSIS — H353131 Nonexudative age-related macular degeneration, bilateral, early dry stage: Secondary | ICD-10-CM | POA: Diagnosis not present

## 2017-12-09 DIAGNOSIS — M1711 Unilateral primary osteoarthritis, right knee: Secondary | ICD-10-CM | POA: Diagnosis not present

## 2017-12-09 DIAGNOSIS — M1712 Unilateral primary osteoarthritis, left knee: Secondary | ICD-10-CM | POA: Diagnosis not present

## 2018-01-25 ENCOUNTER — Telehealth: Payer: Self-pay | Admitting: Cardiology

## 2018-01-25 DIAGNOSIS — R69 Illness, unspecified: Secondary | ICD-10-CM | POA: Diagnosis not present

## 2018-01-25 NOTE — Telephone Encounter (Signed)
Left message to return call 

## 2018-01-25 NOTE — Telephone Encounter (Signed)
°*  STAT* If patient is at the pharmacy, call can be transferred to refill team.   1. Which medications need to be refilled? (please list name of each medication and dose if known) Eliquis 5 mg twice daily  2. Which pharmacy/location (including street and city if local pharmacy) is medication to be sent to? [facility] [address]   3. Do they need a 30 day or 90 day supply?[place]

## 2018-01-26 NOTE — Telephone Encounter (Signed)
Patient last seen Dr. Otho Perl in September. Advised patient that he could call Dr. Sudie Grumbling office and have them refill Eliquis and then see Dr. Bettina Gavia when we return to [facility] or I could send a 30 day supply of Eliquis and he could see Dr. Bettina Gavia one time in [facility]. Patient decided to call Dr. Sudie Grumbling office to have them refill his Eliquis. Advised patient our [place] schedule should be open within the next week. Patient will return call towards the end of next week to schedule an appointment with Dr. Bettina Gavia in the [facility] office. No further questions.

## 2018-02-21 DIAGNOSIS — M1990 Unspecified osteoarthritis, unspecified site: Secondary | ICD-10-CM

## 2018-02-21 DIAGNOSIS — K219 Gastro-esophageal reflux disease without esophagitis: Secondary | ICD-10-CM

## 2018-02-21 DIAGNOSIS — E039 Hypothyroidism, unspecified: Secondary | ICD-10-CM

## 2018-02-21 HISTORY — DX: Unspecified osteoarthritis, unspecified site: M19.90

## 2018-02-21 HISTORY — DX: Gastro-esophageal reflux disease without esophagitis: K21.9

## 2018-02-21 HISTORY — DX: Hypothyroidism, unspecified: E03.9

## 2018-02-28 NOTE — Progress Notes (Signed)
Cardiology Office Note:    Date:  03/15/2018   ID:  Terry Villanueva, DOB January 01, 1936, MRN [id number]  PCP:  Angelina Sheriff, MD  Cardiologist:  Shirlee More, MD    Referring MD: Angelina Sheriff, MD    ASSESSMENT:    1. PAF (paroxysmal atrial fibrillation) ([place])   2. Long term current use of anticoagulant   3. Hypertensive heart disease with heart failure ([place])    PLAN:    In order of problems listed above:  1. Stable no clinical recurrence continue beta-blocker along with anticoagulation.  We discussed the potential for watchman device if he had a contraindication to anticoagulation long-term 2. Stable continue his current anticoagulant he is comfortable with his minor skin bruising 3. Stable continue beta-blocker for blood pressure control   Next appointment: 1 year   Medication Adjustments/Labs and Tests Ordered: Current medicines are reviewed at length with the patient today.  Concerns regarding medicines are outlined above.  Orders Placed This Encounter  Procedures  . EKG 12-Lead   Meds ordered this encounter  Medications  . metoprolol succinate (TOPROL XL) 25 MG 24 hr tablet    Sig: Take 1 tablet (25 mg total) by mouth daily.    Dispense:  30 tablet    Refill:  11    Chief Complaint  Patient presents with  . Atrial Fibrillation  . Anticoagulation     History of Present Illness:    Terry Villanueva is a 82 y.o. male with a hx of paroxysmal atrial fibrillation anticoagulated last seen 12/31/2016 at [facility] cardiology. Compliance with diet, lifestyle and medications: Yes He has had mild skin bruising but no bleeding with his anticoagulant and no awareness of atrial fibrillation.  He tolerates his beta-blocker but he has to take it once daily for compliance with meds and we will switch him to long-acting metoprolol.  He has had no chest pain palpitations syncope TIA or bleeding complication.  Labs done with his PCP this year requested EKG today sinus rhythm  normal Past Medical History:  Diagnosis Date  . Arthritis 02/21/2018  . Carpal tunnel syndrome of left wrist 08/28/2014  . Cervical radiculopathy 08/16/2014  . Erectile dysfunction due to arterial insufficiency 07/24/2015  . Essential hypertension 06/07/2017  . GERD (gastroesophageal reflux disease) 02/21/2018  . Hypothyroidism 02/21/2018  . Long term current use of anticoagulant 12/19/2016  . PAF (paroxysmal atrial fibrillation) ([place]) 06/07/2017  . Prostate nodule 07/24/2015    Past Surgical History:  Procedure Laterality Date  . BACK SURGERY    . KNEE SURGERY    . STAPEDES SURGERY    . VASECTOMY    . WRIST SURGERY Left 07/2016   Carpal Tunnel    Current Medications: Current Meds  Medication Sig  . Ascorbic Acid (VITAMIN C) 1000 MG tablet Take 1,000 mg by mouth.  . Calcium 600-200 MG-UNIT tablet Take 1 tablet by mouth daily.  Marland Kitchen ELIQUIS 5 MG TABS tablet TAKE 1 TABLET (5 MG TOTAL) BY MOUTH 2 TIMES DAILY.  Marland Kitchen Flaxseed, Linseed, (FLAXSEED OIL MAX STR) 1300 MG CAPS Take by mouth.  . levothyroxine (SYNTHROID, LEVOTHROID) 50 MCG tablet Take by mouth.  . Multiple Vitamins-Minerals (MACULAR HEALTH FORMULA PO) Take by mouth 2 (two) times daily.  . Omega-3 Fatty Acids (FISH OIL) 1200 MG CAPS Take 1,200 mg by mouth 2 (two) times daily.  Marland Kitchen omeprazole (PRILOSEC) 20 MG capsule   . sildenafil (REVATIO) 20 MG tablet 2-5 tab po 1-2 hours before prn  . [  DISCONTINUED] metoprolol tartrate (LOPRESSOR) 25 MG tablet TAKE 0.5 TABLETS (12.5 MG TOTAL) BY MOUTH 2 TIMES DAILY.     Allergies:   Patient has no known allergies.   Social History   Socioeconomic History  . Marital status: Married    Spouse name: Not on file  . Number of children: Not on file  . Years of education: Not on file  . Highest education level: Not on file  Occupational History  . Not on file  Social Needs  . Financial resource strain: Not on file  . Food insecurity:    Worry: Not on file    Inability: Not on file  .  Transportation needs:    Medical: Not on file    Non-medical: Not on file  Tobacco Use  . Smoking status: Never Smoker  . Smokeless tobacco: Never Used  Substance and Sexual Activity  . Alcohol use: Not Currently  . Drug use: Not Currently  . Sexual activity: Not on file  Lifestyle  . Physical activity:    Days per week: Not on file    Minutes per session: Not on file  . Stress: Not on file  Relationships  . Social connections:    Talks on phone: Not on file    Gets together: Not on file    Attends religious service: Not on file    Active member of club or organization: Not on file    Attends meetings of clubs or organizations: Not on file    Relationship status: Not on file  Other Topics Concern  . Not on file  Social History Narrative  . Not on file     Family History: The patient's family history includes Cancer in his brother and mother; Diabetes in his mother; Heart disease in his brother, father, and mother; Hypertension in his brother; Stroke in his mother. ROS:   Please see the history of present illness.    All other systems reviewed and are negative.  EKGs/Labs/Other Studies Reviewed:    The following studies were reviewed today:  EKG:  EKG ordered today.  The ekg ordered today demonstrates sinus rhythm normal  Recent Labs: Recent labs requested from his PCP No results found for requested labs within last 8760 hours.  Recent Lipid Panel No results found for: CHOL, TRIG, HDL, CHOLHDL, VLDL, LDLCALC, LDLDIRECT  Physical Exam:    VS:  BP 130/70 (BP Location: Left Arm, Patient Position: Sitting, Cuff Size: Normal)   Pulse 63   Ht 6' (1.829 m)   Wt 194 lb (88 kg)   SpO2 96%   BMI 26.31 kg/m     Wt Readings from Last 3 Encounters:  03/14/18 194 lb (88 kg)     GEN:  Well nourished, well developed in no acute distress HEENT: Normal NECK: No JVD; No carotid bruits LYMPHATICS: No lymphadenopathy CARDIAC: RRR, no murmurs, rubs, gallops RESPIRATORY:   Clear to auscultation without rales, wheezing or rhonchi  ABDOMEN: Soft, non-tender, non-distended MUSCULOSKELETAL:  No edema; No deformity  SKIN: Warm and dry NEUROLOGIC:  Alert and oriented x 3 PSYCHIATRIC:  Normal affect    Signed, Shirlee More, MD  03/15/2018 7:40 AM    [place]

## 2018-03-14 ENCOUNTER — Ambulatory Visit (INDEPENDENT_AMBULATORY_CARE_PROVIDER_SITE_OTHER): Payer: Medicare HMO | Admitting: Cardiology

## 2018-03-14 ENCOUNTER — Encounter: Payer: Self-pay | Admitting: Cardiology

## 2018-03-14 VITALS — BP 130/70 | HR 63 | Ht 72.0 in | Wt 194.0 lb

## 2018-03-14 DIAGNOSIS — I110 Hypertensive heart disease with heart failure: Secondary | ICD-10-CM | POA: Diagnosis not present

## 2018-03-14 DIAGNOSIS — Z7901 Long term (current) use of anticoagulants: Secondary | ICD-10-CM

## 2018-03-14 DIAGNOSIS — I480 Paroxysmal atrial fibrillation: Secondary | ICD-10-CM | POA: Diagnosis not present

## 2018-03-14 MED ORDER — METOPROLOL SUCCINATE ER 25 MG PO TB24: 25.0000 mg | ORAL_TABLET | Freq: Every day | ORAL | 11 refills | Status: DC

## 2018-03-14 NOTE — Patient Instructions (Signed)
Medication Instructions:   CHANGE: metoprolol tart 12.5mg  twice daily to metoprolol succ 25mg  one tablet daily  Labwork: NONE  Testing/Procedures:  You had an EKG today  Follow-Up: Your physician wants you to follow-up in: 1 year.   You will receive a reminder letter in the mail two months in advance. If you don't receive a letter, please call our office to schedule the follow-up appointment.   Any Other Special Instructions Will Be Listed Below (If Applicable).     If you need a refill on your cardiac medications before your next appointment, please call your pharmacy.

## 2018-03-31 DIAGNOSIS — M1712 Unilateral primary osteoarthritis, left knee: Secondary | ICD-10-CM | POA: Diagnosis not present

## 2018-03-31 DIAGNOSIS — M1711 Unilateral primary osteoarthritis, right knee: Secondary | ICD-10-CM | POA: Diagnosis not present

## 2018-05-18 DIAGNOSIS — B079 Viral wart, unspecified: Secondary | ICD-10-CM | POA: Diagnosis not present

## 2018-05-18 DIAGNOSIS — L821 Other seborrheic keratosis: Secondary | ICD-10-CM | POA: Diagnosis not present

## 2018-05-18 DIAGNOSIS — B078 Other viral warts: Secondary | ICD-10-CM | POA: Diagnosis not present

## 2018-05-18 DIAGNOSIS — L814 Other melanin hyperpigmentation: Secondary | ICD-10-CM | POA: Diagnosis not present

## 2018-05-18 DIAGNOSIS — D1801 Hemangioma of skin and subcutaneous tissue: Secondary | ICD-10-CM | POA: Diagnosis not present

## 2018-05-18 DIAGNOSIS — D229 Melanocytic nevi, unspecified: Secondary | ICD-10-CM | POA: Diagnosis not present

## 2018-05-18 DIAGNOSIS — D485 Neoplasm of uncertain behavior of skin: Secondary | ICD-10-CM | POA: Diagnosis not present

## 2018-05-18 DIAGNOSIS — L570 Actinic keratosis: Secondary | ICD-10-CM | POA: Diagnosis not present

## 2018-05-18 DIAGNOSIS — R208 Other disturbances of skin sensation: Secondary | ICD-10-CM | POA: Diagnosis not present

## 2018-05-19 ENCOUNTER — Other Ambulatory Visit: Payer: Self-pay | Admitting: Cardiology

## 2018-05-19 MED ORDER — ELIQUIS 5 MG PO TABS: ORAL_TABLET | ORAL | 1 refills | Status: DC

## 2018-05-19 NOTE — Telephone Encounter (Signed)
[phone] [address] Tia Alert

## 2018-05-19 NOTE — Telephone Encounter (Signed)
Medication Eliqius 5 mg tablets refilled.

## 2018-06-09 DIAGNOSIS — L570 Actinic keratosis: Secondary | ICD-10-CM | POA: Diagnosis not present

## 2018-06-18 DIAGNOSIS — B029 Zoster without complications: Secondary | ICD-10-CM | POA: Diagnosis not present

## 2018-06-18 DIAGNOSIS — R21 Rash and other nonspecific skin eruption: Secondary | ICD-10-CM | POA: Diagnosis not present

## 2018-07-05 DIAGNOSIS — Z23 Encounter for immunization: Secondary | ICD-10-CM | POA: Diagnosis not present

## 2018-07-11 DIAGNOSIS — L578 Other skin changes due to chronic exposure to nonionizing radiation: Secondary | ICD-10-CM | POA: Diagnosis not present

## 2018-07-11 DIAGNOSIS — L570 Actinic keratosis: Secondary | ICD-10-CM | POA: Diagnosis not present

## 2018-07-13 DIAGNOSIS — M1711 Unilateral primary osteoarthritis, right knee: Secondary | ICD-10-CM | POA: Diagnosis not present

## 2018-07-13 DIAGNOSIS — M1712 Unilateral primary osteoarthritis, left knee: Secondary | ICD-10-CM | POA: Diagnosis not present

## 2018-07-20 DIAGNOSIS — R69 Illness, unspecified: Secondary | ICD-10-CM | POA: Diagnosis not present

## 2018-08-08 ENCOUNTER — Other Ambulatory Visit: Payer: Self-pay | Admitting: Gastroenterology

## 2018-08-08 ENCOUNTER — Telehealth: Payer: Self-pay

## 2018-08-08 MED ORDER — ELIQUIS 5 MG PO TABS: ORAL_TABLET | ORAL | 2 refills | Status: DC

## 2018-08-08 NOTE — Telephone Encounter (Signed)
Rx sent to pharmacy as requested.

## 2018-08-16 ENCOUNTER — Other Ambulatory Visit: Payer: Self-pay | Admitting: Cardiology

## 2018-08-16 MED ORDER — ELIQUIS 5 MG PO TABS: ORAL_TABLET | ORAL | 3 refills | Status: DC

## 2018-08-16 NOTE — Telephone Encounter (Signed)
Eliquis 5 mg twice daily refilled.  

## 2018-08-16 NOTE — Telephone Encounter (Signed)
°*  STAT* If patient is at the pharmacy, call can be transferred to refill team.   1. Which medications need to be refilled? (please list name of each medication and dose if known) Eliquis 5mg  takes 1 twice daily  2. Which pharmacy/location (including street and city if local pharmacy) is medication to [facility] sent to?[facility] [address]   3. Do they need a 30 day or 90 day supply? 60 for 3 refills

## 2018-09-01 DIAGNOSIS — N529 Male erectile dysfunction, unspecified: Secondary | ICD-10-CM | POA: Diagnosis not present

## 2018-09-01 DIAGNOSIS — D485 Neoplasm of uncertain behavior of skin: Secondary | ICD-10-CM | POA: Diagnosis not present

## 2018-09-01 DIAGNOSIS — Z125 Encounter for screening for malignant neoplasm of prostate: Secondary | ICD-10-CM | POA: Diagnosis not present

## 2018-09-01 DIAGNOSIS — Z6824 Body mass index (BMI) 24.0-24.9, adult: Secondary | ICD-10-CM | POA: Diagnosis not present

## 2018-09-01 DIAGNOSIS — E782 Mixed hyperlipidemia: Secondary | ICD-10-CM | POA: Diagnosis not present

## 2018-09-01 DIAGNOSIS — M1990 Unspecified osteoarthritis, unspecified site: Secondary | ICD-10-CM | POA: Diagnosis not present

## 2018-09-01 DIAGNOSIS — I4891 Unspecified atrial fibrillation: Secondary | ICD-10-CM | POA: Diagnosis not present

## 2018-09-01 DIAGNOSIS — Z13220 Encounter for screening for lipoid disorders: Secondary | ICD-10-CM | POA: Diagnosis not present

## 2018-09-01 DIAGNOSIS — B079 Viral wart, unspecified: Secondary | ICD-10-CM | POA: Diagnosis not present

## 2018-09-01 DIAGNOSIS — Z0000 Encounter for general adult medical examination without abnormal findings: Secondary | ICD-10-CM | POA: Diagnosis not present

## 2018-09-01 DIAGNOSIS — E039 Hypothyroidism, unspecified: Secondary | ICD-10-CM | POA: Diagnosis not present

## 2018-09-20 DIAGNOSIS — M7542 Impingement syndrome of left shoulder: Secondary | ICD-10-CM | POA: Insufficient documentation

## 2018-09-20 DIAGNOSIS — M25512 Pain in left shoulder: Secondary | ICD-10-CM | POA: Diagnosis not present

## 2018-09-20 HISTORY — DX: Impingement syndrome of left shoulder: M75.42

## 2018-09-22 ENCOUNTER — Other Ambulatory Visit: Payer: Self-pay | Admitting: Gastroenterology

## 2018-10-04 ENCOUNTER — Other Ambulatory Visit: Payer: Self-pay | Admitting: Gastroenterology

## 2018-10-04 NOTE — Telephone Encounter (Signed)
Is this refill appropriate for this patient, since he has never been seen at [facility]?

## 2018-10-06 NOTE — Telephone Encounter (Signed)
Please call in omeprazole 20 mg p.o. once a day, 30 with 6 refills Follow-up in 6 months or so

## 2018-10-12 ENCOUNTER — Other Ambulatory Visit: Payer: Self-pay

## 2018-10-12 NOTE — Progress Notes (Signed)
Encounter opened in error

## 2018-10-26 DIAGNOSIS — M170 Bilateral primary osteoarthritis of knee: Secondary | ICD-10-CM | POA: Diagnosis not present

## 2018-11-11 DIAGNOSIS — D1801 Hemangioma of skin and subcutaneous tissue: Secondary | ICD-10-CM | POA: Diagnosis not present

## 2018-11-11 DIAGNOSIS — L819 Disorder of pigmentation, unspecified: Secondary | ICD-10-CM | POA: Diagnosis not present

## 2018-11-11 DIAGNOSIS — L821 Other seborrheic keratosis: Secondary | ICD-10-CM | POA: Diagnosis not present

## 2018-11-11 DIAGNOSIS — D229 Melanocytic nevi, unspecified: Secondary | ICD-10-CM | POA: Diagnosis not present

## 2018-11-11 DIAGNOSIS — L570 Actinic keratosis: Secondary | ICD-10-CM | POA: Diagnosis not present

## 2018-12-28 ENCOUNTER — Telehealth: Payer: Self-pay | Admitting: Cardiology

## 2018-12-28 ENCOUNTER — Telehealth (INDEPENDENT_AMBULATORY_CARE_PROVIDER_SITE_OTHER): Payer: Medicare HMO | Admitting: Cardiology

## 2018-12-28 ENCOUNTER — Telehealth: Payer: Self-pay

## 2018-12-28 ENCOUNTER — Encounter: Payer: Self-pay | Admitting: Cardiology

## 2018-12-28 VITALS — Ht 72.0 in | Wt 179.0 lb

## 2018-12-28 DIAGNOSIS — I480 Paroxysmal atrial fibrillation: Secondary | ICD-10-CM | POA: Diagnosis not present

## 2018-12-28 DIAGNOSIS — Z7901 Long term (current) use of anticoagulants: Secondary | ICD-10-CM | POA: Diagnosis not present

## 2018-12-28 DIAGNOSIS — I119 Hypertensive heart disease without heart failure: Secondary | ICD-10-CM

## 2018-12-28 DIAGNOSIS — E039 Hypothyroidism, unspecified: Secondary | ICD-10-CM

## 2018-12-28 MED ORDER — DILTIAZEM HCL 30 MG PO TABS: 30.0000 mg | ORAL_TABLET | Freq: Three times a day (TID) | ORAL | 0 refills | Status: DC | PRN

## 2018-12-28 NOTE — Telephone Encounter (Signed)
YOUR CARDIOLOGY TEAM HAS ARRANGED FOR AN E-VISIT FOR YOUR APPOINTMENT - PLEASE REVIEW IMPORTANT INFORMATION BELOW SEVERAL DAYS PRIOR TO YOUR APPOINTMENT  Due to the recent COVID-19 pandemic, we are transitioning in-person office visits to tele-medicine visits in an effort to decrease unnecessary exposure to our patients, their families, and staff. Medicare and most insurances are covering these visits without a copay needed. We also encourage you to sign up for MyChart if you have not already done so. You will need a smartphone if possible. For patients that do not have this, we can still complete the visit using a regular telephone but do prefer a smartphone to enable video when possible. You may have a family member that lives with you that can help. If possible, we also ask that you have a blood pressure cuff and scale at home to measure your blood pressure, heart rate and weight prior to your scheduled appointment. Patients with clinical needs that need an in-person evaluation and testing will still be able to come to the office if absolutely necessary. If you have any questions, feel free to call our office.  CONSENT FOR TELE-HEALTH VISIT - PLEASE REVIEW  I hereby voluntarily request, consent and authorize [facility] and its employed or contracted physicians, physician assistants, nurse practitioners or other licensed health care professionals (the Practitioner), to provide me with telemedicine health care services (the Services") as deemed necessary by the treating Practitioner. I acknowledge and consent to receive the Services by the Practitioner via telemedicine. I understand that the telemedicine visit will involve communicating with the Practitioner through live audiovisual communication technology and the disclosure of certain medical information by electronic transmission. I acknowledge that I have been given the opportunity to request an in-person assessment or other available alternative  prior to the telemedicine visit and am voluntarily participating in the telemedicine visit.  I understand that I have the right to withhold or withdraw my consent to the use of telemedicine in the course of my care at any time, without affecting my right to future care or treatment, and that the Practitioner or I may terminate the telemedicine visit at any time. I understand that I have the right to inspect all information obtained and/or recorded in the course of the telemedicine visit and may receive copies of available information for a reasonable fee.  I understand that some of the potential risks of receiving the Services via telemedicine include:   Delay or interruption in medical evaluation due to technological equipment failure or disruption;  Information transmitted may not be sufficient (e.g. poor resolution of images) to allow for appropriate medical decision making by the Practitioner; and/or   In rare instances, security protocols could fail, causing a breach of personal health information.  Furthermore, I acknowledge that it is my responsibility to provide information about my medical history, conditions and care that is complete and accurate to the best of my ability. I acknowledge that Practitioner's advice, recommendations, and/or decision may be based on factors not within their control, such as incomplete or inaccurate data provided by me or distortions of diagnostic images or specimens that may result from electronic transmissions. I understand that the practice of medicine is not an exact science and that Practitioner makes no warranties or guarantees regarding treatment outcomes. I acknowledge that I will receive a copy of this consent concurrently upon execution via email to the email address I last provided but may also request a printed copy by calling the office of [place].  I understand that my insurance will be billed for this visit.   I have read or had this  consent read to me.  I understand the contents of this consent, which adequately explains the benefits and risks of the Services being provided via telemedicine.   I have been provided ample opportunity to ask questions regarding this consent and the Services and have had my questions answered to my satisfaction.  I give my informed consent for the services to be provided through the use of telemedicine in my medical care  By participating in this telemedicine visit I agree to the above.  Patient gives verbal consent for televisit 12/28/2018 pp

## 2018-12-28 NOTE — Progress Notes (Signed)
Virtual Visit via Video Note   This visit type was conducted due to national recommendations for restrictions regarding the COVID-19 Pandemic (e.g. social distancing) in an effort to limit this patient's exposure and mitigate transmission in our community.  Due to his co-morbid illnesses, this patient is at least at moderate risk for complications without adequate follow up.  This format is felt to be most appropriate for this patient at this time.  All issues noted in this document were discussed and addressed.  A limited physical exam was performed with this format.  Please refer to the patient's chart for his consent to telehealth for [facility].   Evaluation Performed:  Follow-up visit  Date:  12/28/2018    ID:  Deirdre Evener, DOB [birth year], MRN [id number]  Patient Location: Home Provider Location: Office  PCP:  Angelina Sheriff, MD  Cardiologist:  No primary care provider on file. Dr Bettina Gavia Electrophysiologist:  None   Chief Complaint: Atrial fibrillation  History of Present Illness:    Terry Villanueva is a 83 y.o. male with a hx of paroxysmal atrial fibrillation anticoagulated.  I received a message through his primary care physician re recurrent atrial fibrillation and  we contacted the patient.  he has a adapter for his smart phone to record EKG he downloaded and I have reviewed.  He was last seen 03/14/18 in sinus rhythm.  He woke this morning was aware of his heart beating checked the strip was found to be in atrial fibrillation.  Presently he is not having symptoms this is his first recurrent episode since last summer he takes no over-the-counter medications and relates he had lab work done in December including thyroid.  Right now he is feeling well no chest pain shortness of breath syncope TIA and is compliant with his medications including a long-acting beta-blocker and his anticoagulant.  The patient does not have symptoms concerning for COVID-19 infection (fever,  chills, cough, or new shortness of breath).    Past Medical History:  Diagnosis Date  . Arthritis 02/21/2018  . Carpal tunnel syndrome of left wrist 08/28/2014  . Cervical radiculopathy 08/16/2014  . Erectile dysfunction due to arterial insufficiency 07/24/2015  . Essential hypertension 06/07/2017  . GERD (gastroesophageal reflux disease) 02/21/2018  . Hypothyroidism 02/21/2018  . Long term current use of anticoagulant 12/19/2016  . PAF (paroxysmal atrial fibrillation) ([place]) 06/07/2017  . Prostate nodule 07/24/2015   Past Surgical History:  Procedure Laterality Date  . BACK SURGERY    . KNEE SURGERY    . STAPEDES SURGERY    . VASECTOMY    . WRIST SURGERY Left 07/2016   Carpal Tunnel     No outpatient medications have been marked as taking for the 12/28/18 encounter (Appointment) with Richardo Priest, MD.     Allergies:   Patient has no known allergies.   Social History   Tobacco Use  . Smoking status: Never Smoker  . Smokeless tobacco: Never Used  Substance Use Topics  . Alcohol use: Not Currently  . Drug use: Not Currently     Family Hx: The patient's family history includes Cancer in his brother and mother; Diabetes in his mother; Heart disease in his brother, father, and mother; Hypertension in his brother; Stroke in his mother.  ROS:   Please see the history of present illness.     All other systems reviewed and are negative.   Prior CV studies:   The following studies were reviewed today:  Labs/Other Tests and Data Reviewed:    EKG: He transmitted by email strips from the iPhone adapter performed this morning 8:01 AM which confirmed atrial fibrillation with a ventricular rate of 140 bpm  Recent Labs: No results found for requested labs within last 8760 hours.   Recent Lipid Panel No results found for: CHOL, TRIG, HDL, CHOLHDL, LDLCALC, LDLDIRECT  Wt Readings from Last 3 Encounters:  03/14/18 194 lb (88 kg)     Objective:    Vital Signs:  There were  no vitals taken for this visit.   Constitutional, well-nourished well-developed in no acute distress Vital signs reviewed Eyes, conjunctiva and sclera are normal without pallor or icterus extraocular motions intact and normal there is no lid lag Respiratory, normal effort and excursion no audible wheezing without a stethoscope Cardiovascular, no neck vein distention or peripheral edema Skin, no rash skin lesion or ulceration of the extremities Neurologic, cranial nerves II to XII are grossly intact and the patient moves all 4 extremities Neuro/Psychiatric, judgment and thought processes are intact and coherent, alert and oriented x3, mood and affect appear normal.  ASSESSMENT & PLAN:    1. Atrial fibrillation paroxysmal recurrent today he is minimally symptomatic the episode may be over having transmit another strip and will give him a prescription for short acting Cardizem to take PRN.  Labs requested from December including thyroid.  We will continue his anticoagulant 2. Continue his anticoagulant 3. Stable I did ask him to purchase a blood pressure cuff and I think it is important to have it checked frequently especially during this time of distance from physicians 4. Continue his current supplement await recent labs  COVID-19 Education: The signs and symptoms of COVID-19 were discussed with the patient and how to seek care for testing (follow up with PCP or arrange E-visit).  The importance of social distancing was discussed today.  Time:   Today, I have spent 25 minutes with the patient with telehealth technology discussing the above problems.     Medication Adjustments/Labs and Tests Ordered: Current medicines are reviewed at length with the patient today.  Concerns regarding medicines are outlined above.   Tests Ordered: No orders of the defined types were placed in this encounter.   Medication Changes: No orders of the defined types were placed in this encounter.    Disposition:  Follow up depending on transmitter rhythm strip  Signed, Shirlee More, MD  12/28/2018 9:04 AM    [place]

## 2018-12-28 NOTE — Addendum Note (Signed)
Addended by: Beckey Rutter on: 12/28/2018 10:59 AM   Modules accepted: Orders

## 2018-12-28 NOTE — Telephone Encounter (Signed)
RN went over telehealth requirements and patient verbally agreed to consent.

## 2018-12-28 NOTE — Telephone Encounter (Signed)
Dr. Bettina Gavia told patient to get a home BP monitor and he is asking for what brand to be recommended? Please advise.

## 2018-12-28 NOTE — Telephone Encounter (Signed)
Called patient because he states he is in afib according to Chad app. Pt would like virtual visit with Dr. Angela Adam today, copy of kardia information to be sent to RN email prior to [email or web address] appt.

## 2018-12-28 NOTE — Telephone Encounter (Signed)
Patient advised to purchase [employer] BP monitor that fits around the upper home for home blood pressures.  Patient agreed to plan and verbalized understanding.

## 2018-12-28 NOTE — Patient Instructions (Addendum)
RN called patient on 12/28/18 at 1050 to review AVS. Patient informed on medication addition and questions answered. Scheduled for 4 wk f/u.  Medication Instructions:  START taking cardizem 30 mg (1 tablet) as needed every 8 hours for heart rate greater than 110 BPM  If you need a refill on your cardiac medications before your next appointment, please call your pharmacy.   Lab work: NONE If you have labs (blood work) drawn today and your tests are completely normal, you will receive your results only by: Marland Kitchen MyChart Message (if you have MyChart) OR . A paper copy in the mail If you have any lab test that is abnormal or we need to change your treatment, we will call you to review the results.  Testing/Procedures: NONE  Follow-Up: At [facility], you and your health needs are our priority.  As part of our continuing mission to provide you with exceptional heart care, we have created designated Provider Care Teams.  These Care Teams include your primary Cardiologist (physician) and Advanced Practice Providers (APPs -  Physician Assistants and Nurse Practitioners) who all work together to provide you with the care you need, when you need it. You will need a follow up appointment in 4 weeks.    Any Other Special Instructions Will Be Listed Below  Diltiazem tablets What is this medicine? DILTIAZEM (dil TYE a zem) is a calcium-channel blocker. It affects the amount of calcium found in your heart and muscle cells. This relaxes your blood vessels, which can reduce the amount of work the heart has to do. This medicine is used to treat chest pain caused by angina. This medicine may be used for other purposes; ask your health care provider or pharmacist if you have questions. COMMON BRAND NAME(S): Cardizem What should I tell my health care provider before I take this medicine? They need to know if you have any of these conditions: -heart problems, low blood pressure, irregular heartbeat -liver  disease -previous heart attack -an unusual or allergic reaction to diltiazem, other medicines, foods, dyes, or preservatives -pregnant or trying to get pregnant -breast-feeding How should I use this medicine? Take this medicine by mouth with a glass of water. Follow the directions on the prescription label. Do not cut, crush or chew this medicine. This medicine is usually taken before meals and at bedtime. Take your doses at regular intervals. Do not take your medicine more often then directed. Do not stop taking except on the advice of your doctor or health care professional. Talk to your pediatrician regarding the use of this medicine in children. Special care may be needed. Overdosage: If you think you have taken too much of this medicine contact a poison control center or emergency room at once. NOTE: This medicine is only for you. Do not share this medicine with others. What if I miss a dose? If you miss a dose, take it as soon as you can. If it is almost time for your next dose, take only that dose. Do not take double or extra doses. What may interact with this medicine? Do not take this medicine with any of the following: -cisapride -hawthorn -pimozide -ranolazine -red yeast rice This medicine may also interact with the following medications: -buspirone -carbamazepine -cimetidine -cyclosporine -digoxin -local anesthetics or general anesthetics -lovastatin -medicines for anxiety or difficulty sleeping like midazolam and triazolam -medicines for high blood pressure or heart problems -quinidine -rifampin, rifabutin, or rifapentine This list may not describe all possible interactions. Give your health  care provider a list of all the medicines, herbs, non-prescription drugs, or dietary supplements you use. Also tell them if you smoke, drink alcohol, or use illegal drugs. Some items may interact with your medicine. What should I watch for while using this medicine? Check your blood  pressure and pulse rate regularly. Ask your doctor or health care professional what your blood pressure and pulse rate should be and when you should contact him or her. You may feel dizzy or lightheaded. Do not drive, use machinery, or do anything that needs mental alertness until you know how this medicine affects you. To reduce the risk of dizzy or fainting spells, do not sit or stand up quickly, especially if you are an older patient. Alcohol can make you more dizzy or increase flushing and rapid heartbeats. Avoid alcoholic drinks. What side effects may I notice from receiving this medicine? Side effects that you should report to your doctor or health care professional as soon as possible: -allergic reactions like skin rash, itching or hives, swelling of the face, lips, or tongue -confusion, mental depression -feeling faint or lightheaded, falls -pinpoint red spots on the skin -redness, blistering, peeling or loosening of the skin, including inside the mouth -slow, irregular heartbeat -swelling of the ankles, feet -unusual bleeding or bruising Side effects that usually do not require medical attention (report to your doctor or health care professional if they continue or are bothersome): -change in sex drive or performance -constipation or diarrhea -flushing of the face -headache -nausea, vomiting -tired or weak -trouble sleeping This list may not describe all possible side effects. Call your doctor for medical advice about side effects. You may report side effects to FDA at 1-800-FDA-1088. Where should I keep my medicine? Keep out of the reach of children. Store at room temperature between 20 and 25 degrees C (68 and 77 degrees F). Protect from light. Keep container tightly closed. Throw away any unused medicine after the expiration date. NOTE: This sheet is a summary. It may not cover all possible information. If you have questions about this medicine, talk to your doctor, pharmacist, or  health care provider.  2019 Elsevier/Gold Standard (2013-08-14 10:54:31)

## 2018-12-29 ENCOUNTER — Telehealth: Payer: Medicare HMO | Admitting: Cardiology

## 2019-01-17 NOTE — Progress Notes (Signed)
Virtual Visit via Video Note   This visit type was conducted due to national recommendations for restrictions regarding the COVID-19 Pandemic (e.g. social distancing) in an effort to limit this patient's exposure and mitigate transmission in our community.  Due to his co-morbid illnesses, this patient is at least at moderate risk for complications without adequate follow up.  This format is felt to be most appropriate for this patient at this time.  All issues noted in this document were discussed and addressed.  A limited physical exam was performed with this format.  Please refer to the patient's chart for his consent to telehealth for [facility].   Date:  01/18/2019   ID:  Terry Villanueva, DOB Mar 03, 1936, MRN [id number]  Patient Location: Home Provider Location: Office  PCP:  Angelina Sheriff, MD  Cardiologist:  No primary care provider on file. Dr Bettina Gavia Electrophysiologist:  None   Evaluation Performed:  Follow-Up Visit  Chief Complaint:  PAF  History of Present Illness:    Terry Villanueva is a 83 y.o. male with a hx of paroxysmal atrial fibrillation anticoagulated.   He is a very insightful man monitors himself closely as the iPhone adapter has had no recurrent atrial fibrillation transmitted to strip today they independently reviewed sinus rhythm no atrial premature contractions.  Tolerates his anticoagulant without bleeding he is active and has no exercise intolerance dyspnea chest pain or TIA.  He is concerned about his wife who had an alarm for irregular heart rhythm he recorded her strip this morning that was unclassified we will send it to the office for me to review and I encouraged him to have her make an appointment as she may well also have atrial fibrillation.  At this time we do not think we need to institute an antiarrhythmic drug unless he is having clinical recurrence and he has a prescription for rate slowing calcium channel blocker to take PRN.  The patient does not  have symptoms concerning for COVID-19 infection (fever, chills, cough, or new shortness of breath).    Past Medical History:  Diagnosis Date  . Arthritis 02/21/2018  . Carpal tunnel syndrome of left wrist 08/28/2014  . Cervical radiculopathy 08/16/2014  . Erectile dysfunction due to arterial insufficiency 07/24/2015  . Essential hypertension 06/07/2017  . GERD (gastroesophageal reflux disease) 02/21/2018  . Hypothyroidism 02/21/2018  . Long term current use of anticoagulant 12/19/2016  . PAF (paroxysmal atrial fibrillation) ([place]) 06/07/2017  . Prostate nodule 07/24/2015   Past Surgical History:  Procedure Laterality Date  . BACK SURGERY    . KNEE SURGERY    . STAPEDES SURGERY    . VASECTOMY    . WRIST SURGERY Left 07/2016   Carpal Tunnel     Current Meds  Medication Sig  . diltiazem (CARDIZEM) 30 MG tablet Take 1 tablet (30 mg total) by mouth every 8 (eight) hours as needed (for heart rate greater than 110 BPM).     Allergies:   Patient has no known allergies.   Social History   Tobacco Use  . Smoking status: Never Smoker  . Smokeless tobacco: Never Used  Substance Use Topics  . Alcohol use: Not Currently  . Drug use: Not Currently     Family Hx: The patient's family history includes Cancer in his brother and mother; Diabetes in his mother; Heart disease in his brother, father, and mother; Hypertension in his brother; Stroke in his mother.  ROS:   Please see the history of present illness.  All other systems reviewed an antiarrhythmic and are negative.   Prior CV studies:   The following studies were reviewed today:    Labs/Other Tests and Data Reviewed:    EKG:  The patient's Apple Watch cardiac telemetry strip(s) personally reviewed today demonstrate:  sinus rhythm  Recent Labs: No results found for requested labs within last 8760 hours.   Recent Lipid Panel No results found for: CHOL, TRIG, HDL, CHOLHDL, LDLCALC, LDLDIRECT  Wt Readings from Last 3  Encounters:  01/18/19 180 lb (81.6 kg)  12/28/18 179 lb (81.2 kg)  03/14/18 194 lb (88 kg)     Objective:    Vital Signs:  BP 132/75 (BP Location: Left Arm, Patient Position: Sitting)   Pulse (!) 56   Ht 6' (1.829 m)   Wt 180 lb (81.6 kg)   BMI 24.41 kg/m    VITAL SIGNS:  reviewed GEN:  no acute distress EYES:  sclerae anicteric, EOMI - Extraocular Movements Intact RESPIRATORY:  normal respiratory effort, symmetric expansion CARDIOVASCULAR:  no peripheral edema SKIN:  no rash, lesions or ulcers. MUSCULOSKELETAL:  no obvious deformities. NEURO:  alert and oriented x 3, no obvious focal deficit PSYCH:  normal affect  ASSESSMENT & PLAN:     1.  Atrial fibrillation paroxysmal stable maintaining sinus rhythm he is surveyed at home using the iPhone adapter at this time I am not can start an antiarrhythmic drug.  Strategy is anticoagulation and rate limiting calcium channel blocker is needed. 2.  Anticoagulation stable continue same  COVID-19 Education: The signs and symptoms of COVID-19 were discussed with the patient and how to seek care for testing (follow up with PCP or arrange E-visit).  The importance of social distancing was discussed today.  Time:   Today, I have spent 20 minutes with the patient with telehealth technology discussing the above problems.     Medication Adjustments/Labs and Tests Ordered: Current medicines are reviewed at length with the patient today.  Concerns regarding medicines are outlined above.   Tests Ordered: No orders of the defined types were placed in this encounter.   Medication Changes: No orders of the defined types were placed in this encounter.   Disposition:  Follow up in 6 month(s)  Signed, Shirlee More, MD  01/18/2019 10:25 AM    [facility]

## 2019-01-18 ENCOUNTER — Telehealth (INDEPENDENT_AMBULATORY_CARE_PROVIDER_SITE_OTHER): Payer: Medicare HMO | Admitting: Cardiology

## 2019-01-18 ENCOUNTER — Other Ambulatory Visit: Payer: Self-pay

## 2019-01-18 ENCOUNTER — Encounter: Payer: Self-pay | Admitting: Cardiology

## 2019-01-18 VITALS — BP 132/75 | HR 56 | Ht 72.0 in | Wt 180.0 lb

## 2019-01-18 DIAGNOSIS — I480 Paroxysmal atrial fibrillation: Secondary | ICD-10-CM

## 2019-01-18 DIAGNOSIS — Z7901 Long term (current) use of anticoagulants: Secondary | ICD-10-CM

## 2019-01-18 NOTE — Patient Instructions (Signed)
Medication Instructions:  Your physician recommends that you continue on your current medications as directed. Please refer to the Current Medication list given to you today. If you need a refill on your cardiac medications before your next appointment, please call your pharmacy.   Lab work: None If you have labs (blood work) drawn today and your tests are completely normal, you will receive your results only by: Marland Kitchen MyChart Message (if you have MyChart) OR . A paper copy in the mail If you have any lab test that is abnormal or we need to change your treatment, we will call you to review the results.  Testing/Procedures: None  Follow-Up: At [facility], you and your health needs are our priority.  As part of our continuing mission to provide you with exceptional heart care, we have created designated Provider Care Teams.  These Care Teams include your primary Cardiologist (physician) and Advanced Practice Providers (APPs -  Physician Assistants and Nurse Practitioners) who all work together to provide you with the care you need, when you need it. You will need a follow up appointment in 6 months.  Any Other Special Instructions Will Be Listed Below (If Applicable).

## 2019-01-19 ENCOUNTER — Other Ambulatory Visit: Payer: Self-pay | Admitting: Cardiology

## 2019-01-19 NOTE — Telephone Encounter (Signed)
Rx refill sent to pharmacy. 

## 2019-02-01 DIAGNOSIS — B078 Other viral warts: Secondary | ICD-10-CM | POA: Diagnosis not present

## 2019-02-01 DIAGNOSIS — B079 Viral wart, unspecified: Secondary | ICD-10-CM | POA: Diagnosis not present

## 2019-02-09 DIAGNOSIS — H353131 Nonexudative age-related macular degeneration, bilateral, early dry stage: Secondary | ICD-10-CM | POA: Diagnosis not present

## 2019-02-13 DIAGNOSIS — M1711 Unilateral primary osteoarthritis, right knee: Secondary | ICD-10-CM | POA: Diagnosis not present

## 2019-02-13 DIAGNOSIS — M1712 Unilateral primary osteoarthritis, left knee: Secondary | ICD-10-CM | POA: Diagnosis not present

## 2019-02-28 ENCOUNTER — Telehealth: Payer: Self-pay | Admitting: Cardiology

## 2019-02-28 NOTE — Telephone Encounter (Signed)
Virtual Visit Pre-Appointment Phone Call  "(Name), I am calling you today to discuss your upcoming appointment. We are currently trying to limit exposure to the virus that causes COVID-19 by seeing patients at home rather than in the office."  1. "What is the BEST phone number to call the day of the visit?" - include this in appointment notes  2. Do you have or have access to (through a family member/friend) a smartphone with video capability that we can use for your visit?" a. If yes - list this number in appt notes as cell (if different from BEST phone #) and list the appointment type as a VIDEO visit in appointment notes b. If no - list the appointment type as a PHONE visit in appointment notes  Confirm consent - "In the setting of the current Covid19 crisis, you are scheduled for a (phone or video) visit with your provider on (date) at (time).  Just as we do with many in-office visits, in order for you to participate in this visit, we must obtain consent.  If you'd like, I can send this to your mychart (if signed up) or email for you to review.  Otherwise, I can obtain your verbal consent now.  All virtual visits are billed to your insurance company just like a normal visit would be.  By agreeing to a virtual visit, we'd like you to understand that the technology does not allow for your provider to perform an examination, and thus may limit your provider's ability to fully assess your condition. If your provider identifies any concerns that need to be evaluated in person, we will make arrangements to do so.  Finally, though the technology is pretty good, we cannot assure that it will always work on either your or our end, and in the setting of a video visit, we may have to convert it to a phone-only visit.  In either situation, we cannot ensure that we have a secure connection.  Are you willing to proceed?" STAFF: Did the patient verbally acknowledge consent to telehealth visit? Document  YES/NO here: Yes per pt.  3. Advise patient to be prepared - "Two hours prior to your appointment, go ahead and check your blood pressure, pulse, oxygen saturation, and your weight (if you have the equipment to check those) and write them all down. When your visit starts, your provider will ask you for this information. If you have an Apple Watch or Kardia device, please plan to have heart rate information ready on the day of your appointment. Please have a pen and paper handy nearby the day of the visit as well."  4. Give patient instructions for MyChart download to smartphone OR Doximity/Doxy.me as below if video visit (depending on what platform provider is using)  5. Inform patient they will receive a phone call 15 minutes prior to their appointment time (may be from unknown caller ID) so they should be prepared to answer    TELEPHONE CALL NOTE  Terry Villanueva has been deemed a candidate for a follow-up tele-health visit to limit community exposure during the Covid-19 pandemic. I spoke with the patient via phone to ensure availability of phone/video source, confirm preferred email & phone number, and discuss instructions and expectations.  I reminded Terry Villanueva to be prepared with any vital sign and/or heart rhythm information that could potentially be obtained via home monitoring, at the time of his visit. I reminded Terry Villanueva to expect a phone call prior to his visit.  Terry Villanueva 02/28/2019 3:37 PM   INSTRUCTIONS FOR DOWNLOADING THE MYCHART APP TO SMARTPHONE  - The patient must first make sure to have activated MyChart and know their login information - If Apple, go to [employer] and type in MyChart in the search bar and download the app. If Android, ask patient to go to [employer] and type in [facility] in the search bar and download the app. The app is free but as with any other app downloads, their phone may require them to verify saved payment information or Apple/Android  password.  - The patient will need to then log into the app with their MyChart username and password, and select [facility] as their healthcare provider to link the account. When it is time for your visit, go to the MyChart app, find appointments, and click Begin Video Visit. Be sure to Select Allow for your device to access the Microphone and Camera for your visit. You will then be connected, and your provider will be with you shortly.  **If they have any issues connecting, or need assistance please contact MyChart service desk [phone])**  **If using a computer, in order to ensure the best quality for their visit they will need to use either of the following Internet Browsers: [employer], or Google Chrome**  IF USING DOXIMITY or DOXY.ME - The patient will receive a link just prior to their visit by text.     FULL LENGTH CONSENT FOR TELE-HEALTH VISIT   I hereby voluntarily request, consent and authorize [facility] and its employed or contracted physicians, physician assistants, nurse practitioners or other licensed health care professionals (the Practitioner), to provide me with telemedicine health care services (the Services") as deemed necessary by the treating Practitioner. I acknowledge and consent to receive the Services by the Practitioner via telemedicine. I understand that the telemedicine visit will involve communicating with the Practitioner through live audiovisual communication technology and the disclosure of certain medical information by electronic transmission. I acknowledge that I have been given the opportunity to request an in-person assessment or other available alternative prior to the telemedicine visit and am voluntarily participating in the telemedicine visit.  I understand that I have the right to withhold or withdraw my consent to the use of telemedicine in the course of my care at any time, without affecting my right to future care or treatment,  and that the Practitioner or I may terminate the telemedicine visit at any time. I understand that I have the right to inspect all information obtained and/or recorded in the course of the telemedicine visit and may receive copies of available information for a reasonable fee.  I understand that some of the potential risks of receiving the Services via telemedicine include:   Delay or interruption in medical evaluation due to technological equipment failure or disruption;  Information transmitted may not be sufficient (e.g. poor resolution of images) to allow for appropriate medical decision making by the Practitioner; and/or   In rare instances, security protocols could fail, causing a breach of personal health information.  Furthermore, I acknowledge that it is my responsibility to provide information about my medical history, conditions and care that is complete and accurate to the best of my ability. I acknowledge that Practitioner's advice, recommendations, and/or decision may be based on factors not within their control, such as incomplete or inaccurate data provided by me or distortions of diagnostic images or specimens that may result from electronic transmissions. I understand that the  practice of medicine is not an Chief Strategy Officer and that Practitioner makes no warranties or guarantees regarding treatment outcomes. I acknowledge that I will receive a copy of this consent concurrently upon execution via email to the email address I last provided but may also request a printed copy by calling the office of [facility].    I understand that my insurance will be billed for this visit.   I have read or had this consent read to me.  I understand the contents of this consent, which adequately explains the benefits and risks of the Services being provided via telemedicine.   I have been provided ample opportunity to ask questions regarding this consent and the Services and have had my questions  answered to my satisfaction.  I give my informed consent for the services to be provided through the use of telemedicine in my medical care  By participating in this telemedicine visit I agree to the above.

## 2019-03-01 ENCOUNTER — Telehealth: Payer: Medicare HMO | Admitting: Cardiology

## 2019-03-01 ENCOUNTER — Telehealth: Payer: Self-pay | Admitting: Cardiology

## 2019-03-01 NOTE — Telephone Encounter (Signed)
Informed patient Dr. Bettina Gavia thinks its OK for family to visit if he's comfortable that they've been careful and had no COVID exposure. No further questions verbalized.

## 2019-03-01 NOTE — Telephone Encounter (Signed)
Patient has a question for the nurse

## 2019-03-01 NOTE — Telephone Encounter (Signed)
If he is comfortable that they have been careful and no concerning exposure I would visit with them

## 2019-03-01 NOTE — Telephone Encounter (Signed)
Patient would like to get Dr. Joya Gaskins advice on whether he feels its safe in terms of potential COVID exposure for his kids and grandchildren ranging in age from 12-20's to visit this weekend for an overnight. He is feeling fine and has no cardiac or other concerns at this time.   pls advise, tx

## 2019-04-22 ENCOUNTER — Other Ambulatory Visit: Payer: Self-pay | Admitting: Cardiology

## 2019-05-08 ENCOUNTER — Other Ambulatory Visit: Payer: Self-pay

## 2019-05-08 MED ORDER — METOPROLOL SUCCINATE ER 25 MG PO TB24: 25.0000 mg | ORAL_TABLET | Freq: Every day | ORAL | 3 refills | Status: DC

## 2019-05-11 DIAGNOSIS — L570 Actinic keratosis: Secondary | ICD-10-CM | POA: Diagnosis not present

## 2019-05-11 DIAGNOSIS — D485 Neoplasm of uncertain behavior of skin: Secondary | ICD-10-CM | POA: Diagnosis not present

## 2019-05-11 DIAGNOSIS — B079 Viral wart, unspecified: Secondary | ICD-10-CM | POA: Diagnosis not present

## 2019-05-11 DIAGNOSIS — L708 Other acne: Secondary | ICD-10-CM | POA: Diagnosis not present

## 2019-05-11 DIAGNOSIS — L821 Other seborrheic keratosis: Secondary | ICD-10-CM | POA: Diagnosis not present

## 2019-05-17 ENCOUNTER — Other Ambulatory Visit: Payer: Self-pay | Admitting: Gastroenterology

## 2019-06-13 DIAGNOSIS — Z79899 Other long term (current) drug therapy: Secondary | ICD-10-CM | POA: Diagnosis not present

## 2019-06-13 DIAGNOSIS — Z6824 Body mass index (BMI) 24.0-24.9, adult: Secondary | ICD-10-CM | POA: Diagnosis not present

## 2019-06-13 DIAGNOSIS — M25472 Effusion, left ankle: Secondary | ICD-10-CM | POA: Diagnosis not present

## 2019-06-13 DIAGNOSIS — Z23 Encounter for immunization: Secondary | ICD-10-CM | POA: Diagnosis not present

## 2019-06-13 DIAGNOSIS — E7439 Other disorders of intestinal carbohydrate absorption: Secondary | ICD-10-CM | POA: Diagnosis not present

## 2019-06-13 DIAGNOSIS — E039 Hypothyroidism, unspecified: Secondary | ICD-10-CM | POA: Diagnosis not present

## 2019-06-26 DIAGNOSIS — M1712 Unilateral primary osteoarthritis, left knee: Secondary | ICD-10-CM | POA: Diagnosis not present

## 2019-06-26 DIAGNOSIS — M1711 Unilateral primary osteoarthritis, right knee: Secondary | ICD-10-CM | POA: Diagnosis not present

## 2019-07-05 ENCOUNTER — Other Ambulatory Visit: Payer: Self-pay | Admitting: Gastroenterology

## 2019-07-26 ENCOUNTER — Other Ambulatory Visit: Payer: Self-pay | Admitting: Cardiology

## 2019-07-26 ENCOUNTER — Other Ambulatory Visit: Payer: Self-pay

## 2019-07-26 ENCOUNTER — Encounter: Payer: Self-pay | Admitting: Cardiology

## 2019-07-26 ENCOUNTER — Ambulatory Visit (INDEPENDENT_AMBULATORY_CARE_PROVIDER_SITE_OTHER): Payer: Medicare HMO | Admitting: Cardiology

## 2019-07-26 VITALS — BP 124/74 | HR 64 | Ht 72.0 in | Wt 183.8 lb

## 2019-07-26 DIAGNOSIS — E039 Hypothyroidism, unspecified: Secondary | ICD-10-CM | POA: Diagnosis not present

## 2019-07-26 DIAGNOSIS — I480 Paroxysmal atrial fibrillation: Secondary | ICD-10-CM | POA: Diagnosis not present

## 2019-07-26 DIAGNOSIS — I119 Hypertensive heart disease without heart failure: Secondary | ICD-10-CM | POA: Diagnosis not present

## 2019-07-26 DIAGNOSIS — Z7901 Long term (current) use of anticoagulants: Secondary | ICD-10-CM | POA: Diagnosis not present

## 2019-07-26 NOTE — Progress Notes (Signed)
Cardiology Office Note:    Date:  07/26/2019   ID:  Terry Villanueva, DOB 02-26-1936, MRN [id number]  PCP:  Angelina Sheriff, MD  Cardiologist:  Shirlee More, MD    Referring MD: Angelina Sheriff, MD    ASSESSMENT:    1. PAF (paroxysmal atrial fibrillation) ([place])   2. Long term current use of anticoagulant   3. Hypertensive heart disease without heart failure   4. Hypothyroidism (acquired)    PLAN:    In order of problems listed above:  1. Stable no clinical recurrence if he had frequent episodes would use an antiarrhythmic drug he has good healthcare literacy will continue beta-blocker as needed rate limiting calcium channel blocker anticoagulated and continue to screen his heart rhythm frequently at home 2. Moderate stroke risk continue his anticoagulant 3. Well controlled BP at target continue beta-blocker 4. Stable on Synthroid noticed by his PCP clinically he does not have symptoms of hypothyroidism   Next appointment: 6 months   Medication Adjustments/Labs and Tests Ordered: Current medicines are reviewed at length with the patient today.  Concerns regarding medicines are outlined above.  Orders Placed This Encounter  Procedures  . EKG 12-Lead   No orders of the defined types were placed in this encounter.   No chief complaint on file.   History of Present Illness:    Terry Villanueva is a 83 y.o. male with a hx of paroxysmal atrial fibrillation anticoagulated last seen 01/18/2019. Compliance with diet, lifestyle and medications: Yes  He has a smart phone adapter screen his heart rhythm shows  episodes possible atrial fibrillation but is sinus rhythm with artifact.  He has had no recurrence and has not needed to use his rate lowering calcium channel blocker.  He tolerates his anticoagulant without bleeding no palpitations syncope chest pain shortness of breath he has intermittent edema and is paretic left lower extremity from polio Past Medical History:   Diagnosis Date  . Arthritis 02/21/2018  . Carpal tunnel syndrome of left wrist 08/28/2014  . Cervical radiculopathy 08/16/2014  . Erectile dysfunction due to arterial insufficiency 07/24/2015  . Essential hypertension 06/07/2017  . GERD (gastroesophageal reflux disease) 02/21/2018  . Hypothyroidism 02/21/2018  . Long term current use of anticoagulant 12/19/2016  . PAF (paroxysmal atrial fibrillation) ([place]) 06/07/2017  . Prostate nodule 07/24/2015    Past Surgical History:  Procedure Laterality Date  . BACK SURGERY    . KNEE SURGERY    . STAPEDES SURGERY    . VASECTOMY    . WRIST SURGERY Left 07/2016   Carpal Tunnel    Current Medications: Current Meds  Medication Sig  . diltiazem (CARDIZEM) 30 MG tablet TAKE 1 TABLET (30 MG TOTAL) BY MOUTH EVERY 8 (EIGHT) HOURS AS NEEDED (FOR HEART RATE GREATER THAN 110 BPM).  Marland Kitchen ELIQUIS 5 MG TABS tablet TAKE 1 TABLET BY MOUTH TWICE A DAY  . Flaxseed, Linseed, (FLAXSEED OIL MAX STR) 1300 MG CAPS Take 1 tablet by mouth 2 (two) times daily.   Marland Kitchen levothyroxine (SYNTHROID, LEVOTHROID) 50 MCG tablet Take 50 mcg by mouth daily before breakfast.   . metoprolol succinate (TOPROL XL) 25 MG 24 hr tablet Take 1 tablet (25 mg total) by mouth daily.  . Multiple Vitamins-Minerals (MACULAR HEALTH FORMULA PO) Take by mouth 2 (two) times daily.  . Omega-3 Fatty Acids (FISH OIL) 1200 MG CAPS Take 1,200 mg by mouth 2 (two) times daily.  Marland Kitchen omeprazole (PRILOSEC) 20 MG capsule Take 20 mg by mouth  every other day.  . sildenafil (REVATIO) 20 MG tablet 2-5 tab po 1-2 hours before prn  . vitamin C (ASCORBIC ACID) 500 MG tablet Take 500 mg by mouth daily.      Allergies:   Patient has no known allergies.   Social History   Socioeconomic History  . Marital status: Married    Spouse name: Not on file  . Number of children: Not on file  . Years of education: Not on file  . Highest education level: Not on file  Occupational History  . Not on file  Social Needs  . Financial  resource strain: Not on file  . Food insecurity    Worry: Not on file    Inability: Not on file  . Transportation needs    Medical: Not on file    Non-medical: Not on file  Tobacco Use  . Smoking status: Never Smoker  . Smokeless tobacco: Never Used  Substance and Sexual Activity  . Alcohol use: Not Currently  . Drug use: Not Currently  . Sexual activity: Not on file  Lifestyle  . Physical activity    Days per week: Not on file    Minutes per session: Not on file  . Stress: Not on file  Relationships  . Social Herbalist on phone: Not on file    Gets together: Not on file    Attends religious service: Not on file    Active member of club or organization: Not on file    Attends meetings of clubs or organizations: Not on file    Relationship status: Not on file  Other Topics Concern  . Not on file  Social History Narrative  . Not on file     Family History: The patient's family history includes Cancer in his brother and mother; Diabetes in his mother; Heart disease in his brother, father, and mother; Hypertension in his brother; Stroke in his mother. ROS:   Please see the history of present illness.    All other systems reviewed and are negative.  EKGs/Labs/Other Studies Reviewed:    The following studies were reviewed today:  EKG:  EKG ordered today and personally reviewed.  The ekg ordered today demonstrates sinus rhythm and is normal  Recent Labs: 09/01/2018: Cholesterol 150 HDL 35 LDL 96 A1c 5.6% hemoglobin 14.5 creatinine 0.7 potassium 3.9 TSH is elevated 10.81 performed in September and is due for repeat in the next few weeks  Physical Exam:    VS:  BP 124/74 (BP Location: Left Arm, Patient Position: Sitting, Cuff Size: Normal)   Pulse 64   Ht 6' (1.829 m)   Wt 183 lb 12.8 oz (83.4 kg)   SpO2 92%   BMI 24.93 kg/m     Wt Readings from Last 3 Encounters:  07/26/19 183 lb 12.8 oz (83.4 kg)  01/18/19 180 lb (81.6 kg)  12/28/18 179 lb (81.2 kg)      GEN:  Well nourished, well developed in no acute distress HEENT: Normal NECK: No JVD; No carotid bruits LYMPHATICS: No lymphadenopathy CARDIAC: RRR, no murmurs, rubs, gallops RESPIRATORY:  Clear to auscultation without rales, wheezing or rhonchi  ABDOMEN: Soft, non-tender, non-distended MUSCULOSKELETAL:  No edema; No deformity  SKIN: Warm and dry NEUROLOGIC:  Alert and oriented x 3 PSYCHIATRIC:  Normal affect    Signed, Shirlee More, MD  07/26/2019 10:35 AM    [place]

## 2019-07-26 NOTE — Patient Instructions (Signed)
Medication Instructions:  Your physician recommends that you continue on your current medications as directed. Please refer to the Current Medication list given to you today.  *If you need a refill on your cardiac medications before your next appointment, please call your pharmacy*  Lab Work: None  If you have labs (blood work) drawn today and your tests are completely normal, you will receive your results only by: . MyChart Message (if you have MyChart) OR . A paper copy in the mail If you have any lab test that is abnormal or we need to change your treatment, we will call you to review the results.  Testing/Procedures: You had an EKG today.   Follow-Up: At [facility], you and your health needs are our priority.  As part of our continuing mission to provide you with exceptional heart care, we have created designated Provider Care Teams.  These Care Teams include your primary Cardiologist (physician) and Advanced Practice Providers (APPs -  Physician Assistants and Nurse Practitioners) who all work together to provide you with the care you need, when you need it.  Your next appointment:   6 month(s)  The format for your next appointment:   In Person  Provider:   Brian Munley, MD   

## 2019-07-31 NOTE — Telephone Encounter (Signed)
Eliquis refill sent to [facility] in [facility]

## 2019-08-03 ENCOUNTER — Ambulatory Visit: Payer: Medicare HMO | Admitting: Sports Medicine

## 2019-08-03 ENCOUNTER — Encounter: Payer: Self-pay | Admitting: Sports Medicine

## 2019-08-03 ENCOUNTER — Other Ambulatory Visit: Payer: Self-pay

## 2019-08-03 DIAGNOSIS — M79675 Pain in left toe(s): Secondary | ICD-10-CM | POA: Diagnosis not present

## 2019-08-03 DIAGNOSIS — L84 Corns and callosities: Secondary | ICD-10-CM | POA: Diagnosis not present

## 2019-08-03 DIAGNOSIS — B351 Tinea unguium: Secondary | ICD-10-CM | POA: Diagnosis not present

## 2019-08-03 DIAGNOSIS — M79674 Pain in right toe(s): Secondary | ICD-10-CM | POA: Diagnosis not present

## 2019-08-03 DIAGNOSIS — Z7901 Long term (current) use of anticoagulants: Secondary | ICD-10-CM

## 2019-08-03 NOTE — Progress Notes (Signed)
Subjective: Terry Villanueva is a 83 y.o. male patient seen today in office with complaint of mildly painful thickened and elongated toenails and callus; unable to trim. Patient denies history of Diabetes or known issue with neuropathy.  Patient does admit to some temperature changes to the feet and legs and is on Eliquis.  Patient has no other pedal complaints at this time.   Review of Systems  All other systems reviewed and are negative.    Patient Active Problem List   Diagnosis Date Noted  . GERD (gastroesophageal reflux disease) 02/21/2018  . Hypothyroidism 02/21/2018  . Arthritis 02/21/2018  . PAF (paroxysmal atrial fibrillation) ([place]) 06/07/2017  . Hypertensive heart disease 06/07/2017  . Long term current use of anticoagulant 12/19/2016  . Erectile dysfunction due to arterial insufficiency 07/24/2015  . Prostate nodule 07/24/2015  . Carpal tunnel syndrome of left wrist 08/28/2014  . Cervical radiculopathy 08/16/2014    Current Outpatient Medications on File Prior to Visit  Medication Sig Dispense Refill  . diltiazem (CARDIZEM) 30 MG tablet TAKE 1 TABLET (30 MG TOTAL) BY MOUTH EVERY 8 (EIGHT) HOURS AS NEEDED (FOR HEART RATE GREATER THAN 110 BPM). 90 tablet 5  . ELIQUIS 5 MG TABS tablet TAKE 1 TABLET BY MOUTH TWICE A DAY 180 tablet 0  . Flaxseed, Linseed, (FLAXSEED OIL MAX STR) 1300 MG CAPS Take 1 tablet by mouth 2 (two) times daily.     Marland Kitchen levothyroxine (SYNTHROID, LEVOTHROID) 50 MCG tablet Take 50 mcg by mouth daily before breakfast.     . metoprolol succinate (TOPROL XL) 25 MG 24 hr tablet Take 1 tablet (25 mg total) by mouth daily. 30 tablet 3  . Multiple Vitamins-Minerals (MACULAR HEALTH FORMULA PO) Take by mouth 2 (two) times daily.    . Omega-3 Fatty Acids (FISH OIL) 1200 MG CAPS Take 1,200 mg by mouth 2 (two) times daily.    Marland Kitchen omeprazole (PRILOSEC) 20 MG capsule Take 20 mg by mouth every other day.    . sildenafil (REVATIO) 20 MG tablet 2-5 tab po 1-2 hours before prn    .  vitamin C (ASCORBIC ACID) 500 MG tablet Take 500 mg by mouth daily.      No current facility-administered medications on file prior to visit.     No Known Allergies  Objective: Physical Exam  General: Well developed, nourished, no acute distress, awake, alert and oriented x 3  Vascular: Dorsalis pedis artery 1/4 bilateral, Posterior tibial artery 1/4 bilateral, skin temperature warm to warm proximal to distal bilateral lower extremities, moderate varicosities, decreased pedal hair present bilateral.  Neurological: Gross sensation present via light touch bilateral.   Dermatological: Skin is warm, dry, and supple bilateral, Nails 1-10 are tender, long, thick, and discolored with mild subungal debris, no webspace macerations present bilateral, no open lesions present bilateral, callus submet 5 present bilateral. No signs of infection bilateral.  Musculoskeletal: Asymptomatic hammertoe boney deformities noted bilateral. Muscular strength within normal limits without painon range of motion. No pain with calf compression bilateral.  Assessment and Plan:  Problem List Items Addressed This Visit      Other   Long term current use of anticoagulant    Other Visit Diagnoses    Pain due to onychomycosis of toenails of both feet    -  Primary   Callus of foot          -Examined patient.  -Discussed treatment options for painful mycotic nails. -Mechanically debrided and reduced mycotic nails with sterile nail nipper and  dremel nail file without incident. -Mechanically debrided callus x2 using sterile chisel blade without incident -Patient to return in 3 months for follow up evaluation or sooner if symptoms worsen.  Landis Martins, DPM

## 2019-08-08 ENCOUNTER — Other Ambulatory Visit: Payer: Self-pay | Admitting: Gastroenterology

## 2019-08-22 DIAGNOSIS — E039 Hypothyroidism, unspecified: Secondary | ICD-10-CM | POA: Diagnosis not present

## 2019-08-31 ENCOUNTER — Other Ambulatory Visit: Payer: Self-pay | Admitting: Cardiology

## 2019-10-05 DIAGNOSIS — Z0000 Encounter for general adult medical examination without abnormal findings: Secondary | ICD-10-CM | POA: Diagnosis not present

## 2019-10-05 DIAGNOSIS — Z79899 Other long term (current) drug therapy: Secondary | ICD-10-CM | POA: Diagnosis not present

## 2019-10-05 DIAGNOSIS — M8580 Other specified disorders of bone density and structure, unspecified site: Secondary | ICD-10-CM | POA: Diagnosis not present

## 2019-10-05 DIAGNOSIS — Z6824 Body mass index (BMI) 24.0-24.9, adult: Secondary | ICD-10-CM | POA: Diagnosis not present

## 2019-10-05 DIAGNOSIS — E782 Mixed hyperlipidemia: Secondary | ICD-10-CM | POA: Diagnosis not present

## 2019-10-05 DIAGNOSIS — Z9181 History of falling: Secondary | ICD-10-CM | POA: Diagnosis not present

## 2019-10-05 DIAGNOSIS — Z125 Encounter for screening for malignant neoplasm of prostate: Secondary | ICD-10-CM | POA: Diagnosis not present

## 2019-10-05 DIAGNOSIS — Z1331 Encounter for screening for depression: Secondary | ICD-10-CM | POA: Diagnosis not present

## 2019-10-12 ENCOUNTER — Encounter: Payer: Self-pay | Admitting: Sports Medicine

## 2019-10-12 ENCOUNTER — Other Ambulatory Visit: Payer: Self-pay

## 2019-10-12 ENCOUNTER — Ambulatory Visit: Payer: Medicare HMO | Admitting: Sports Medicine

## 2019-10-12 DIAGNOSIS — Z7901 Long term (current) use of anticoagulants: Secondary | ICD-10-CM | POA: Diagnosis not present

## 2019-10-12 DIAGNOSIS — M79675 Pain in left toe(s): Secondary | ICD-10-CM | POA: Diagnosis not present

## 2019-10-12 DIAGNOSIS — M79674 Pain in right toe(s): Secondary | ICD-10-CM | POA: Diagnosis not present

## 2019-10-12 DIAGNOSIS — B351 Tinea unguium: Secondary | ICD-10-CM | POA: Diagnosis not present

## 2019-10-12 DIAGNOSIS — L84 Corns and callosities: Secondary | ICD-10-CM

## 2019-10-12 NOTE — Progress Notes (Signed)
Subjective: Terry Villanueva is a 84 y.o. male patient seen today in office with complaint of mildly painful thickened and elongated toenails and callus; unable to trim. Patient still on Eliquis. Denies any changes with medications or medical history.  Patient has no other pedal complaints at this time.   Patient Active Problem List   Diagnosis Date Noted  . GERD (gastroesophageal reflux disease) 02/21/2018  . Hypothyroidism 02/21/2018  . Arthritis 02/21/2018  . PAF (paroxysmal atrial fibrillation) ([place]) 06/07/2017  . Hypertensive heart disease 06/07/2017  . Long term current use of anticoagulant 12/19/2016  . Erectile dysfunction due to arterial insufficiency 07/24/2015  . Prostate nodule 07/24/2015  . Carpal tunnel syndrome of left wrist 08/28/2014  . Cervical radiculopathy 08/16/2014    Current Outpatient Medications on File Prior to Visit  Medication Sig Dispense Refill  . diltiazem (CARDIZEM) 30 MG tablet TAKE 1 TABLET (30 MG TOTAL) BY MOUTH EVERY 8 (EIGHT) HOURS AS NEEDED (FOR HEART RATE GREATER THAN 110 BPM). 90 tablet 5  . ELIQUIS 5 MG TABS tablet TAKE 1 TABLET BY MOUTH TWICE A DAY 180 tablet 0  . Flaxseed, Linseed, (FLAXSEED OIL MAX STR) 1300 MG CAPS Take 1 tablet by mouth 2 (two) times daily.     Marland Kitchen levothyroxine (SYNTHROID, LEVOTHROID) 50 MCG tablet Take 50 mcg by mouth daily before breakfast.     . metoprolol succinate (TOPROL-XL) 25 MG 24 hr tablet TAKE 1 TABLET BY MOUTH EVERY DAY 90 tablet 1  . Multiple Vitamins-Minerals (MACULAR HEALTH FORMULA PO) Take by mouth 2 (two) times daily.    . Omega-3 Fatty Acids (FISH OIL) 1200 MG CAPS Take 1,200 mg by mouth 2 (two) times daily.    Marland Kitchen omeprazole (PRILOSEC) 20 MG capsule Take 20 mg by mouth every other day.    . sildenafil (REVATIO) 20 MG tablet 2-5 tab po 1-2 hours before prn    . vitamin C (ASCORBIC ACID) 500 MG tablet Take 500 mg by mouth daily.      No current facility-administered medications on file prior to visit.    No  Known Allergies  Objective: Physical Exam  General: Well developed, nourished, no acute distress, awake, alert and oriented x 3  Vascular: Dorsalis pedis artery 1/4 bilateral, Posterior tibial artery 1/4 bilateral, skin temperature warm to warm proximal to distal bilateral lower extremities, moderate varicosities, decreased pedal hair present bilateral.  Neurological: Gross sensation present via light touch bilateral.   Dermatological: Skin is warm, dry, and supple bilateral, Nails 1-10 are tender, long, thick, and discolored with mild subungal debris, no webspace macerations present bilateral, no open lesions present bilateral, callus submet 5 present bilateral. No signs of infection bilateral.  Musculoskeletal: Asymptomatic hammertoe boney deformities noted bilateral. Muscular strength within normal limits without painon range of motion. No pain with calf compression bilateral.  Assessment and Plan:  Problem List Items Addressed This Visit      Other   Long term current use of anticoagulant    Other Visit Diagnoses    Pain due to onychomycosis of toenails of both feet    -  Primary   Callus of foot          -Examined patient.  -Discussed treatment options for painful mycotic nails. -Mechanically debrided and reduced mycotic nails with sterile nail nipper and dremel nail file without incident. -Mechanically debrided callus x2 using sterile chisel blade without incident at no additional charge  -Continue with good supportive shoes and foot miracle cream  -Patient to return  in 3 months for follow up evaluation or sooner if symptoms worsen.  Landis Martins, DPM

## 2019-10-23 ENCOUNTER — Other Ambulatory Visit: Payer: Self-pay | Admitting: Cardiology

## 2019-10-26 ENCOUNTER — Other Ambulatory Visit: Payer: Self-pay

## 2019-10-26 ENCOUNTER — Telehealth (INDEPENDENT_AMBULATORY_CARE_PROVIDER_SITE_OTHER): Payer: Medicare HMO | Admitting: Gastroenterology

## 2019-10-26 ENCOUNTER — Encounter: Payer: Self-pay | Admitting: Gastroenterology

## 2019-10-26 VITALS — Ht 72.0 in | Wt 180.0 lb

## 2019-10-26 DIAGNOSIS — K219 Gastro-esophageal reflux disease without esophagitis: Secondary | ICD-10-CM | POA: Diagnosis not present

## 2019-10-26 DIAGNOSIS — K449 Diaphragmatic hernia without obstruction or gangrene: Secondary | ICD-10-CM

## 2019-10-26 MED ORDER — OMEPRAZOLE 20 MG PO CPDR: 20.0000 mg | DELAYED_RELEASE_CAPSULE | Freq: Every day | ORAL | 11 refills | Status: DC

## 2019-10-26 NOTE — Progress Notes (Signed)
Chief Complaint: FU  Referring Provider:  Angelina Sheriff, MD      ASSESSMENT AND PLAN;   #1. GERD with [place] #2. H/O esophageal dysphagia d/t Schatzki's ring s/p EGD with dil 06/2015.  Plan: Continue omeprazole 20 mg p.o. QD, 30, 11 refills.  Can try it every other day. Nonpharmacologic means of reflux control Follow-up as needed   HPI:    Terry Villanueva is a 84 y.o. male  For follow-up visit Here for omeprazole refill Doing well  No further dysphagia or heartburn.  No nausea, vomiting, heartburn, regurgitation, odynophagia or dysphagia.  No significant diarrhea or constipation (does take Metamucil).  No melena or hematochezia. No unintentional weight loss. No abdominal pain.   Past GI procedures: -EGD 11/2014 Schatzki's ring s/p esophageal dilatation, small hiatal hernia, negative CLO. -Colonoscopy 11/2010: Mild sigmoid diverticulosis, small internal hemorrhoids.  Repeat only if with any new problems or change in family history. Past Medical History:  Diagnosis Date  . Arthritis 02/21/2018  . Carpal tunnel syndrome of left wrist 08/28/2014  . Cervical radiculopathy 08/16/2014  . Erectile dysfunction due to arterial insufficiency 07/24/2015  . Essential hypertension 06/07/2017  . GERD (gastroesophageal reflux disease) 02/21/2018  . Hypothyroidism 02/21/2018  . Long term current use of anticoagulant 12/19/2016  . PAF (paroxysmal atrial fibrillation) ([place]) 06/07/2017  . Prostate nodule 07/24/2015    Past Surgical History:  Procedure Laterality Date  . BACK SURGERY    . KNEE SURGERY    . STAPEDES SURGERY    . VASECTOMY    . WRIST SURGERY Left 07/2016   Carpal Tunnel    Family History  Problem Relation Age of Onset  . Cancer Mother   . Diabetes Mother   . Heart disease Mother   . Stroke Mother   . Heart disease Father   . Cancer Brother   . Heart disease Brother   . Hypertension Brother     Social History   Tobacco Use  . Smoking status: Never Smoker  .  Smokeless tobacco: Never Used  Substance Use Topics  . Alcohol use: Not Currently  . Drug use: Not Currently    Current Outpatient Medications  Medication Sig Dispense Refill  . diltiazem (CARDIZEM) 30 MG tablet TAKE 1 TABLET (30 MG TOTAL) BY MOUTH EVERY 8 (EIGHT) HOURS AS NEEDED (FOR HEART RATE GREATER THAN 110 BPM). (Patient not taking: Reported on 10/24/2019) 90 tablet 5  . ELIQUIS 5 MG TABS tablet TAKE 1 TABLET BY MOUTH TWICE A DAY 180 tablet 0  . Flaxseed, Linseed, (FLAXSEED OIL MAX STR) 1300 MG CAPS Take 1 tablet by mouth 2 (two) times daily.     Marland Kitchen levothyroxine (SYNTHROID, LEVOTHROID) 50 MCG tablet Take 50 mcg by mouth daily before breakfast.     . metoprolol succinate (TOPROL-XL) 25 MG 24 hr tablet TAKE 1 TABLET BY MOUTH EVERY DAY 90 tablet 1  . Multiple Vitamins-Minerals (MACULAR HEALTH FORMULA PO) Take 1 tablet by mouth 2 (two) times daily.     . Omega-3 Fatty Acids (FISH OIL) 1200 MG CAPS Take 1,200 mg by mouth 2 (two) times daily.    Marland Kitchen omeprazole (PRILOSEC) 20 MG capsule Take 20 mg by mouth every other day.    . sildenafil (REVATIO) 20 MG tablet 2-5 tab po 1-2 hours before prn    . vitamin C (ASCORBIC ACID) 500 MG tablet Take 1,000 mg by mouth daily.      No current facility-administered medications for this visit.  No Known Allergies  Review of Systems:  neg     Physical Exam:    Ht 6' (1.829 m)   Wt 180 lb (81.6 kg)   BMI 24.41 kg/m  Wt Readings from Last 3 Encounters:  10/26/19 180 lb (81.6 kg)  07/26/19 183 lb 12.8 oz (83.4 kg)  01/18/19 180 lb (81.6 kg)   televisit I connected with  Terry Villanueva on 10/26/19 by a phone visit (video tried but failed) application and verified that I am speaking with the correct person using two identifiers.   I discussed the limitations of evaluation and management by telemedicine. The patient expressed understanding and agreed to proceed.  15 min-to review the records and on the phone   Carmell Austria, MD 10/26/2019, 4:04  PM  Cc: Angelina Sheriff, MD

## 2019-11-08 DIAGNOSIS — B079 Viral wart, unspecified: Secondary | ICD-10-CM | POA: Diagnosis not present

## 2019-11-08 DIAGNOSIS — D485 Neoplasm of uncertain behavior of skin: Secondary | ICD-10-CM | POA: Diagnosis not present

## 2019-11-08 DIAGNOSIS — L819 Disorder of pigmentation, unspecified: Secondary | ICD-10-CM | POA: Diagnosis not present

## 2019-12-04 DIAGNOSIS — M1712 Unilateral primary osteoarthritis, left knee: Secondary | ICD-10-CM | POA: Diagnosis not present

## 2019-12-04 DIAGNOSIS — M1711 Unilateral primary osteoarthritis, right knee: Secondary | ICD-10-CM | POA: Diagnosis not present

## 2019-12-28 ENCOUNTER — Other Ambulatory Visit: Payer: Self-pay | Admitting: Cardiology

## 2020-01-11 ENCOUNTER — Ambulatory Visit: Payer: Medicare HMO | Admitting: Sports Medicine

## 2020-01-11 ENCOUNTER — Other Ambulatory Visit: Payer: Self-pay

## 2020-01-11 ENCOUNTER — Encounter: Payer: Self-pay | Admitting: Sports Medicine

## 2020-01-11 DIAGNOSIS — B351 Tinea unguium: Secondary | ICD-10-CM

## 2020-01-11 DIAGNOSIS — M79674 Pain in right toe(s): Secondary | ICD-10-CM | POA: Diagnosis not present

## 2020-01-11 DIAGNOSIS — Z7901 Long term (current) use of anticoagulants: Secondary | ICD-10-CM

## 2020-01-11 DIAGNOSIS — M79675 Pain in left toe(s): Secondary | ICD-10-CM

## 2020-01-11 DIAGNOSIS — L84 Corns and callosities: Secondary | ICD-10-CM

## 2020-01-11 NOTE — Progress Notes (Signed)
Subjective: Terry Villanueva is a 84 y.o. male patient seen today in office with complaint of mildly painful thickened and elongated toenails and callus; unable to trim. Patient still on Eliquis. No new issues.  Patient Active Problem List   Diagnosis Date Noted  . GERD (gastroesophageal reflux disease) 02/21/2018  . Hypothyroidism 02/21/2018  . Arthritis 02/21/2018  . PAF (paroxysmal atrial fibrillation) ([place]) 06/07/2017  . Hypertensive heart disease 06/07/2017  . Long term current use of anticoagulant 12/19/2016  . Erectile dysfunction due to arterial insufficiency 07/24/2015  . Prostate nodule 07/24/2015  . Carpal tunnel syndrome of left wrist 08/28/2014  . Cervical radiculopathy 08/16/2014    Current Outpatient Medications on File Prior to Visit  Medication Sig Dispense Refill  . diltiazem (CARDIZEM) 30 MG tablet TAKE 1 TABLET (30 MG TOTAL) BY MOUTH EVERY 8 (EIGHT) HOURS AS NEEDED (FOR HEART RATE GREATER THAN 110 BPM). (Patient not taking: Reported on 10/24/2019) 90 tablet 5  . ELIQUIS 5 MG TABS tablet TAKE 1 TABLET BY MOUTH TWICE A DAY 180 tablet 1  . Flaxseed, Linseed, (FLAXSEED OIL MAX STR) 1300 MG CAPS Take 1 tablet by mouth 2 (two) times daily.     Marland Kitchen levothyroxine (SYNTHROID, LEVOTHROID) 50 MCG tablet Take 50 mcg by mouth daily before breakfast.     . metoprolol succinate (TOPROL-XL) 25 MG 24 hr tablet TAKE 1 TABLET BY MOUTH EVERY DAY 90 tablet 1  . Multiple Vitamins-Minerals (MACULAR HEALTH FORMULA PO) Take 1 tablet by mouth 2 (two) times daily.     . Omega-3 Fatty Acids (FISH OIL) 1200 MG CAPS Take 1,200 mg by mouth 2 (two) times daily.    Marland Kitchen omeprazole (PRILOSEC) 20 MG capsule Take 1 capsule (20 mg total) by mouth daily. 30 capsule 11  . sildenafil (REVATIO) 20 MG tablet 2-5 tab po 1-2 hours before prn    . vitamin C (ASCORBIC ACID) 500 MG tablet Take 1,000 mg by mouth daily.      No current facility-administered medications on file prior to visit.    No Known  Allergies  Objective: Physical Exam  General: Well developed, nourished, no acute distress, awake, alert and oriented x 3  Vascular: Dorsalis pedis artery 1/4 bilateral, Posterior tibial artery 0/4 bilateral due to trace edema today bilateral, skin temperature warm to warm proximal to distal bilateral lower extremities, moderate varicosities, decreased pedal hair present bilateral.  Neurological: Gross sensation present via light touch bilateral.   Dermatological: Skin is warm, dry, and supple bilateral, Nails 1-10 are tender, long, thick, and discolored with mild subungal debris, no webspace macerations present bilateral, no open lesions present bilateral, callus submet 5 present bilateral. No signs of infection bilateral.  Musculoskeletal: Asymptomatic hammertoe boney deformities noted bilateral. Muscular strength within normal limits without painon range of motion. No pain with calf compression bilateral.  Assessment and Plan:  Problem List Items Addressed This Visit      Other   Long term current use of anticoagulant    Other Visit Diagnoses    Pain due to onychomycosis of toenails of both feet    -  Primary   Callus of foot          -Examined patient.  -Re-Discussed treatment options for painful mycotic nails. -Mechanically debrided and reduced mycotic nails with sterile nail nipper and dremel nail file without incident. -Mechanically debrided callus x2 using sterile chisel blade without incident at no additional charge  -Continue with good supportive shoes and foot miracle cream like before to  help with callus skin -Patient to return in 3 months for follow up evaluation or sooner if symptoms worsen.  Landis Martins, DPM

## 2020-01-15 ENCOUNTER — Encounter: Payer: Self-pay | Admitting: Cardiology

## 2020-01-15 ENCOUNTER — Ambulatory Visit: Payer: Medicare HMO | Admitting: Cardiology

## 2020-01-15 ENCOUNTER — Other Ambulatory Visit: Payer: Self-pay

## 2020-01-15 VITALS — BP 132/78 | HR 88 | Temp 97.1°F | Ht 72.0 in | Wt 188.4 lb

## 2020-01-15 DIAGNOSIS — I119 Hypertensive heart disease without heart failure: Secondary | ICD-10-CM | POA: Diagnosis not present

## 2020-01-15 DIAGNOSIS — Z7901 Long term (current) use of anticoagulants: Secondary | ICD-10-CM

## 2020-01-15 DIAGNOSIS — I480 Paroxysmal atrial fibrillation: Secondary | ICD-10-CM

## 2020-01-15 NOTE — Progress Notes (Signed)
Cardiology Office Note:    Date:  01/15/2020   ID:  Terry Villanueva, DOB 1936-06-07, MRN [id number]  PCP:  Angelina Sheriff, MD  Cardiologist:  Shirlee More, MD    Referring MD: Angelina Sheriff, MD    ASSESSMENT:    1. PAF (paroxysmal atrial fibrillation) ([place])   2. Long term current use of anticoagulant   3. Hypertensive heart disease without heart failure    PLAN:    In order of problems listed above:  1. He continues to do well maintaining sinus rhythm has had no clinical recurrence of atrial fibrillation continue low-dose beta-blocker and anticoagulant 2. BP at target continue his beta-blocker   Next appointment: 1 year or sooner if he has clinical recurrence of atrial fibrillation   Medication Adjustments/Labs and Tests Ordered: Current medicines are reviewed at length with the patient today.  Concerns regarding medicines are outlined above.  No orders of the defined types were placed in this encounter.  No orders of the defined types were placed in this encounter.   Chief Complaint  Patient presents with  . Follow-up  . Atrial Fibrillation  . Anticoagulation    History of Present Illness:    Terry Villanueva is a 84 y.o. male with a hx of asthma atrial fibrillation last seen 07/26/2019. Compliance with diet, lifestyle and medications: Yes  Is mild chronic venous insufficiency controlled.  He has had no recurrent atrial fibrillation or flutter and I reviewed at least 21 days recordings with kardia all sinus rhythm sinus tachycardia he tolerates the beta-blocker without side effect and is anticoagulated without bleeding complications.  No palpitations syncope chest pain shortness of breath. Past Medical History:  Diagnosis Date  . Arthritis 02/21/2018  . Carpal tunnel syndrome of left wrist 08/28/2014  . Cervical radiculopathy 08/16/2014  . Erectile dysfunction due to arterial insufficiency 07/24/2015  . Esophageal dysphagia   . Essential hypertension 06/07/2017    . GERD (gastroesophageal reflux disease) 02/21/2018  . Hypothyroidism 02/21/2018  . Long term current use of anticoagulant 12/19/2016  . PAF (paroxysmal atrial fibrillation) ([place]) 06/07/2017  . Polio    1952  . Prostate nodule 07/24/2015  . Sciatica     Past Surgical History:  Procedure Laterality Date  . BACK SURGERY    . COLONOSCOPY  12/08/2010   Mild sigmoid diverticulosis. Small internal hemorrhoids  . ESOPHAGOGASTRODUODENOSCOPY  07/08/2015   Schatzki ring status post esophageal dilatation. Small hiatal hernia  . FLEXIBLE SIGMOIDOSCOPY  1999   Dr Orlena Sheldon  . KNEE SURGERY    . STAPEDES SURGERY    . VASECTOMY    . WRIST SURGERY Left 07/2016   Carpal Tunnel    Current Medications: Current Meds  Medication Sig  . diltiazem (CARDIZEM) 30 MG tablet TAKE 1 TABLET (30 MG TOTAL) BY MOUTH EVERY 8 (EIGHT) HOURS AS NEEDED (FOR HEART RATE GREATER THAN 110 BPM).  Marland Kitchen ELIQUIS 5 MG TABS tablet TAKE 1 TABLET BY MOUTH TWICE A DAY  . Flaxseed, Linseed, (FLAXSEED OIL MAX STR) 1300 MG CAPS Take 1 tablet by mouth 2 (two) times daily.   Marland Kitchen levothyroxine (SYNTHROID, LEVOTHROID) 50 MCG tablet Take 50 mcg by mouth daily before breakfast.   . metoprolol succinate (TOPROL-XL) 25 MG 24 hr tablet TAKE 1 TABLET BY MOUTH EVERY DAY  . Multiple Vitamins-Minerals (MACULAR HEALTH FORMULA PO) Take 1 tablet by mouth 2 (two) times daily.   . Omega-3 Fatty Acids (FISH OIL) 1200 MG CAPS Take 1,200 mg by mouth 2 (  two) times daily.  Marland Kitchen omeprazole (PRILOSEC) 20 MG capsule Take 20 mg by mouth every other day.     Allergies:   Patient has no known allergies.   Social History   Socioeconomic History  . Marital status: Married    Spouse name: Not on file  . Number of children: Not on file  . Years of education: Not on file  . Highest education level: Not on file  Occupational History  . Not on file  Tobacco Use  . Smoking status: Never Smoker  . Smokeless tobacco: Never Used  Substance and Sexual Activity  .  Alcohol use: Not Currently  . Drug use: Not Currently  . Sexual activity: Not on file  Other Topics Concern  . Not on file  Social History Narrative  . Not on file   Social Determinants of Health   Financial Resource Strain:   . Difficulty of Paying Living Expenses:   Food Insecurity:   . Worried About Charity fundraiser in the Last Year:   . Arboriculturist in the Last Year:   Transportation Needs:   . Film/video editor (Medical):   Marland Kitchen Lack of Transportation (Non-Medical):   Physical Activity:   . Days of Exercise per Week:   . Minutes of Exercise per Session:   Stress:   . Feeling of Stress :   Social Connections:   . Frequency of Communication with Friends and Family:   . Frequency of Social Gatherings with Friends and Family:   . Attends Religious Services:   . Active Member of Clubs or Organizations:   . Attends Archivist Meetings:   Marland Kitchen Marital Status:      Family History: The patient's family history includes Cancer in his brother and mother; Diabetes in his mother; Heart disease in his brother, father, and mother; Hypertension in his brother; Stroke in his mother. ROS:   Please see the history of present illness.    All other systems reviewed and are negative.  EKGs/Labs/Other Studies Reviewed:    The following studies were reviewed today:   Recent Labs: 09/26/2019 labs from his PCP: Cholesterol at target 157 HDL 34 LDL 106 A1c normal 5.6% Normal creatinine 0.70  Physical Exam:    VS:  BP 132/78   Pulse 88   Temp (!) 97.1 F (36.2 C)   Ht 6' (1.829 m)   Wt 188 lb 6.4 oz (85.5 kg)   SpO2 99%   BMI 25.55 kg/m     Wt Readings from Last 3 Encounters:  01/15/20 188 lb 6.4 oz (85.5 kg)  10/26/19 180 lb (81.6 kg)  07/26/19 183 lb 12.8 oz (83.4 kg)     GEN:  Well nourished, well developed in no acute distress HEENT: Normal NECK: No JVD; No carotid bruits LYMPHATICS: No lymphadenopathy CARDIAC: RRR, no murmurs, rubs,  gallops RESPIRATORY:  Clear to auscultation without rales, wheezing or rhonchi  ABDOMEN: Soft, non-tender, non-distended MUSCULOSKELETAL:  No edema; No deformity  SKIN: Warm and dry NEUROLOGIC:  Alert and oriented x 3 PSYCHIATRIC:  Normal affect    Signed, Shirlee More, MD  01/15/2020 11:36 AM    [place]

## 2020-01-15 NOTE — Patient Instructions (Signed)

## 2020-01-24 ENCOUNTER — Other Ambulatory Visit: Payer: Self-pay | Admitting: Cardiology

## 2020-01-25 DIAGNOSIS — R69 Illness, unspecified: Secondary | ICD-10-CM | POA: Diagnosis not present

## 2020-02-15 ENCOUNTER — Other Ambulatory Visit: Payer: Self-pay | Admitting: Cardiology

## 2020-03-28 DIAGNOSIS — M1711 Unilateral primary osteoarthritis, right knee: Secondary | ICD-10-CM | POA: Diagnosis not present

## 2020-03-28 DIAGNOSIS — M25462 Effusion, left knee: Secondary | ICD-10-CM | POA: Diagnosis not present

## 2020-03-28 DIAGNOSIS — M25561 Pain in right knee: Secondary | ICD-10-CM | POA: Diagnosis not present

## 2020-03-28 DIAGNOSIS — M1712 Unilateral primary osteoarthritis, left knee: Secondary | ICD-10-CM | POA: Diagnosis not present

## 2020-04-11 ENCOUNTER — Ambulatory Visit: Payer: Medicare HMO | Admitting: Podiatry

## 2020-04-11 ENCOUNTER — Ambulatory Visit: Payer: Medicare HMO | Admitting: Sports Medicine

## 2020-04-11 ENCOUNTER — Encounter: Payer: Self-pay | Admitting: Podiatry

## 2020-04-11 ENCOUNTER — Other Ambulatory Visit: Payer: Self-pay

## 2020-04-11 DIAGNOSIS — Q828 Other specified congenital malformations of skin: Secondary | ICD-10-CM

## 2020-04-11 DIAGNOSIS — L84 Corns and callosities: Secondary | ICD-10-CM

## 2020-04-11 DIAGNOSIS — M79674 Pain in right toe(s): Secondary | ICD-10-CM | POA: Diagnosis not present

## 2020-04-11 DIAGNOSIS — B351 Tinea unguium: Secondary | ICD-10-CM

## 2020-04-11 DIAGNOSIS — H353131 Nonexudative age-related macular degeneration, bilateral, early dry stage: Secondary | ICD-10-CM | POA: Diagnosis not present

## 2020-04-11 DIAGNOSIS — I739 Peripheral vascular disease, unspecified: Secondary | ICD-10-CM | POA: Diagnosis not present

## 2020-04-11 DIAGNOSIS — M79675 Pain in left toe(s): Secondary | ICD-10-CM | POA: Diagnosis not present

## 2020-04-11 DIAGNOSIS — S99922A Unspecified injury of left foot, initial encounter: Secondary | ICD-10-CM

## 2020-04-11 NOTE — Progress Notes (Addendum)
Subjective:  Patient ID: Terry Villanueva, male    DOB: 1936/02/06,  MRN: [id number]  84 y.o. male presents with painful corn(s) right 2nd toe and callus(es) right foot and painful mycotic nails.  Pain interferes with ambulation. Aggravating factors include wearing enclosed shoe gear. and painful plantar lesions left foot.  Pain prevent comfortable ambulation. Aggravating factor is weightbearing with or without shoegear. Also at risk due to PAD on long term blood thinner, Eliquis.  Patient states a couple of days ago his left 2nd digit toenail got caught on a sock and pulled off. It bled a little bit.   Review of Systems: Negative except as noted in the HPI.  Past Medical History:  Diagnosis Date  . Arthritis 02/21/2018  . Carpal tunnel syndrome of left wrist 08/28/2014  . Cervical radiculopathy 08/16/2014  . Erectile dysfunction due to arterial insufficiency 07/24/2015  . Esophageal dysphagia   . Essential hypertension 06/07/2017  . GERD (gastroesophageal reflux disease) 02/21/2018  . Hypothyroidism 02/21/2018  . Long term current use of anticoagulant 12/19/2016  . PAF (paroxysmal atrial fibrillation) ([place]) 06/07/2017  . Polio    1952  . Prostate nodule 07/24/2015  . Sciatica    Past Surgical History:  Procedure Laterality Date  . BACK SURGERY    . COLONOSCOPY  12/08/2010   Mild sigmoid diverticulosis. Small internal hemorrhoids  . ESOPHAGOGASTRODUODENOSCOPY  07/08/2015   Schatzki ring status post esophageal dilatation. Small hiatal hernia  . FLEXIBLE SIGMOIDOSCOPY  1999   Dr Orlena Sheldon  . KNEE SURGERY    . STAPEDES SURGERY    . VASECTOMY    . WRIST SURGERY Left 07/2016   Carpal Tunnel   Patient Active Problem List   Diagnosis Date Noted  . GERD (gastroesophageal reflux disease) 02/21/2018  . Hypothyroidism 02/21/2018  . Arthritis 02/21/2018  . PAF (paroxysmal atrial fibrillation) ([place]) 06/07/2017  . Hypertensive heart disease 06/07/2017  . Long term current use of anticoagulant  12/19/2016  . Erectile dysfunction due to arterial insufficiency 07/24/2015  . Prostate nodule 07/24/2015  . Carpal tunnel syndrome of left wrist 08/28/2014  . Cervical radiculopathy 08/16/2014    Current Outpatient Medications:  .  diltiazem (CARDIZEM) 30 MG tablet, TAKE 1 TABLET (30 MG TOTAL) BY MOUTH EVERY 8 (EIGHT) HOURS AS NEEDED (FOR HEART RATE GREATER THAN 110 BPM)., Disp: 90 tablet, Rfl: 5 .  ELIQUIS 5 MG TABS tablet, TAKE 1 TABLET BY MOUTH TWICE A DAY, Disp: 180 tablet, Rfl: 1 .  Flaxseed, Linseed, (FLAXSEED OIL MAX STR) 1300 MG CAPS, Take 1 tablet by mouth 2 (two) times daily. , Disp: , Rfl:  .  levothyroxine (SYNTHROID, LEVOTHROID) 50 MCG tablet, Take 50 mcg by mouth daily before breakfast. , Disp: , Rfl:  .  metoprolol succinate (TOPROL-XL) 25 MG 24 hr tablet, TAKE 1 TABLET BY MOUTH EVERY DAY, Disp: 90 tablet, Rfl: 2 .  Multiple Vitamins-Minerals (MACULAR HEALTH FORMULA PO), Take 1 tablet by mouth 2 (two) times daily. , Disp: , Rfl:  .  Omega-3 Fatty Acids (FISH OIL) 1200 MG CAPS, Take 1,200 mg by mouth 2 (two) times daily., Disp: , Rfl:  .  omeprazole (PRILOSEC) 20 MG capsule, Take 20 mg by mouth every other day., Disp: , Rfl:  .  vitamin C (ASCORBIC ACID) 500 MG tablet, Take 1,000 mg by mouth daily. , Disp: , Rfl:  No Known Allergies Social History   Occupational History  . Not on file  Tobacco Use  . Smoking status: Never Smoker  . Smokeless  tobacco: Never Used  Vaping Use  . Vaping Use: Never used  Substance and Sexual Activity  . Alcohol use: Not Currently  . Drug use: Not Currently  . Sexual activity: Not on file    Objective:   Constitutional Pt is a pleasant 84 y.o. Caucasian male WD, WN in NAD.Marland Kitchen AAO x 3.   Vascular Capillary fill time to digits <3 seconds b/l lower extremities. Faintly palpable DP pulse(s) b/l lower extremities. Nonpalpable PT pulse(s) b/l lower extremities. Pedal hair present. Lower extremity skin temperature gradient within normal limits. No  pain with calf compression b/l. No edema noted b/l lower extremities. No cyanosis or clubbing noted.  Neurologic Normal speech. Oriented to person, place, and time. Protective sensation intact 5/5 intact bilaterally with 10g monofilament b/l.  Dermatologic Pedal skin with normal turgor, texture and tone bilaterally. No open wounds bilaterally. No interdigital macerations bilaterally. Hyperkeratotic lesion(s) R 2nd toe and submet head 5 right foot.  No erythema, no edema, no drainage, no flocculence. Porokeratotic lesion(s) submet head 5 left foot. No erythema, no edema, no drainage, no flocculence. Left 2nd toe with small laceration medial border. No erythema, no edema, no drainage.  Orthopedic: Normal muscle strength 5/5 to all lower extremity muscle groups bilaterally. No pain crepitus or joint limitation noted with ROM b/l. Hammertoes noted to the b/l lower extremities. Patient ambulates independent of any assistive aids.   Radiographs: None Assessment:   1. Pain due to onychomycosis of toenails of both feet   2. Corns and callosities   3. Porokeratosis   4. PAD (peripheral artery disease) ([place])    Plan:  Patient was evaluated and treated and all questions answered.  Onychomycosis with pain -Nails palliatively debridement as below. -Educated on self-care  Procedure: Nail Debridement Rationale: Pain Type of Debridement: manual, sharp debridement. Instrumentation: Nail nipper, rotary burr. Number of Nails: 10  -Examined patient. -For small laceration left 2nd toe medial border, instructed Mr. Carbonell to apply antibiotic cream to digit once daily until resolved. Call office if he encounters any problems. -Toenails 1-5 b/l were debrided in length and girth with sterile nail nippers and dremel without iatrogenic bleeding.  -Corn(s) dorsal PIPJ R 2nd toe and callus(es) submet head 5 left foot were pared utilizing sterile scalpel blade without incident. Total number debrided =2. -Painful  porokeratotic lesion(s) submet head 5 left foot pared and enucleated with sterile scalpel blade without incident. -Patient to report any pedal injuries to medical professional immediately. -Patient to continue soft, supportive shoe gear daily. -Patient/POA to call should there be question/concern in the interim.  Return in about 3 months (around 07/12/2020).  Marzetta Board, DPM

## 2020-04-11 NOTE — Addendum Note (Signed)
Addended by: Marzetta Board on: 04/11/2020 09:43 AM   Modules accepted: Level of Service

## 2020-04-17 ENCOUNTER — Other Ambulatory Visit: Payer: Self-pay | Admitting: Cardiology

## 2020-04-17 NOTE — Telephone Encounter (Signed)
Prescription refill request for Eliquis received. Indication:  Atrial  fibrillation Last office visit: 01/15/2020 [place] Scr: 0.7 10/05/2019 Age: 84 Weight:  85.5 kg  Prescription refilled

## 2020-05-23 DIAGNOSIS — Z6823 Body mass index (BMI) 23.0-23.9, adult: Secondary | ICD-10-CM | POA: Diagnosis not present

## 2020-05-23 DIAGNOSIS — M7021 Olecranon bursitis, right elbow: Secondary | ICD-10-CM | POA: Diagnosis not present

## 2020-05-27 DIAGNOSIS — L814 Other melanin hyperpigmentation: Secondary | ICD-10-CM | POA: Diagnosis not present

## 2020-05-27 DIAGNOSIS — L821 Other seborrheic keratosis: Secondary | ICD-10-CM | POA: Diagnosis not present

## 2020-05-27 DIAGNOSIS — I8393 Asymptomatic varicose veins of bilateral lower extremities: Secondary | ICD-10-CM | POA: Diagnosis not present

## 2020-05-27 DIAGNOSIS — D229 Melanocytic nevi, unspecified: Secondary | ICD-10-CM | POA: Diagnosis not present

## 2020-05-27 DIAGNOSIS — D1801 Hemangioma of skin and subcutaneous tissue: Secondary | ICD-10-CM | POA: Diagnosis not present

## 2020-05-27 DIAGNOSIS — L570 Actinic keratosis: Secondary | ICD-10-CM | POA: Diagnosis not present

## 2020-05-27 DIAGNOSIS — L819 Disorder of pigmentation, unspecified: Secondary | ICD-10-CM | POA: Diagnosis not present

## 2020-07-02 DIAGNOSIS — Z23 Encounter for immunization: Secondary | ICD-10-CM | POA: Diagnosis not present

## 2020-07-03 DIAGNOSIS — M25561 Pain in right knee: Secondary | ICD-10-CM | POA: Diagnosis not present

## 2020-07-03 DIAGNOSIS — M170 Bilateral primary osteoarthritis of knee: Secondary | ICD-10-CM | POA: Diagnosis not present

## 2020-07-03 DIAGNOSIS — M25562 Pain in left knee: Secondary | ICD-10-CM | POA: Diagnosis not present

## 2020-07-12 ENCOUNTER — Ambulatory Visit: Payer: Medicare HMO | Admitting: Sports Medicine

## 2020-07-12 ENCOUNTER — Encounter: Payer: Self-pay | Admitting: Sports Medicine

## 2020-07-12 ENCOUNTER — Other Ambulatory Visit: Payer: Self-pay

## 2020-07-12 DIAGNOSIS — L84 Corns and callosities: Secondary | ICD-10-CM

## 2020-07-12 DIAGNOSIS — Z7901 Long term (current) use of anticoagulants: Secondary | ICD-10-CM

## 2020-07-12 DIAGNOSIS — B351 Tinea unguium: Secondary | ICD-10-CM | POA: Diagnosis not present

## 2020-07-12 DIAGNOSIS — I739 Peripheral vascular disease, unspecified: Secondary | ICD-10-CM

## 2020-07-12 DIAGNOSIS — M79675 Pain in left toe(s): Secondary | ICD-10-CM | POA: Diagnosis not present

## 2020-07-12 DIAGNOSIS — M79674 Pain in right toe(s): Secondary | ICD-10-CM

## 2020-07-12 NOTE — Progress Notes (Signed)
Subjective: Terry Villanueva is a 84 y.o. male patient seen today in office with complaint of mildly painful thickened and elongated toenails and callus; unable to trim. Patient still on Eliquis. No new issues.  Patient Active Problem List   Diagnosis Date Noted   GERD (gastroesophageal reflux disease) 02/21/2018   Hypothyroidism 02/21/2018   Arthritis 02/21/2018   PAF (paroxysmal atrial fibrillation) ([place]) 06/07/2017   Hypertensive heart disease 06/07/2017   Long term current use of anticoagulant 12/19/2016   Erectile dysfunction due to arterial insufficiency 07/24/2015   Prostate nodule 07/24/2015   Carpal tunnel syndrome of left wrist 08/28/2014   Cervical radiculopathy 08/16/2014    Current Outpatient Medications on File Prior to Visit  Medication Sig Dispense Refill   diltiazem (CARDIZEM) 30 MG tablet TAKE 1 TABLET (30 MG TOTAL) BY MOUTH EVERY 8 (EIGHT) HOURS AS NEEDED (FOR HEART RATE GREATER THAN 110 BPM). 90 tablet 5   ELIQUIS 5 MG TABS tablet TAKE 1 TABLET BY MOUTH TWICE A DAY 180 tablet 1   Flaxseed, Linseed, (FLAXSEED OIL MAX STR) 1300 MG CAPS Take 1 tablet by mouth 2 (two) times daily.      levothyroxine (SYNTHROID, LEVOTHROID) 50 MCG tablet Take 50 mcg by mouth daily before breakfast.      metoprolol succinate (TOPROL-XL) 25 MG 24 hr tablet TAKE 1 TABLET BY MOUTH EVERY DAY 90 tablet 2   Multiple Vitamins-Minerals (MACULAR HEALTH FORMULA PO) Take 1 tablet by mouth 2 (two) times daily.      Omega-3 Fatty Acids (FISH OIL) 1200 MG CAPS Take 1,200 mg by mouth 2 (two) times daily.     omeprazole (PRILOSEC) 20 MG capsule Take 20 mg by mouth every other day.     vitamin C (ASCORBIC ACID) 500 MG tablet Take 1,000 mg by mouth daily.      No current facility-administered medications on file prior to visit.    No Known Allergies  Objective: Physical Exam  General: Well developed, nourished, no acute distress, awake, alert and oriented x 3  Vascular: Dorsalis  pedis artery 1/4 bilateral, Posterior tibial artery 0/4 bilateral due to trace edema today bilateral, skin temperature warm to warm proximal to distal bilateral lower extremities, moderate varicosities, decreased pedal hair present bilateral.  Neurological: Gross sensation present via light touch bilateral.   Dermatological: Skin is warm, dry, and supple bilateral, Nails 1-10 are tender, long, thick, and discolored with mild subungal debris, no webspace macerations present bilateral, no open lesions present bilateral, callus submet 5 present bilateral. No signs of infection bilateral.  Musculoskeletal: Asymptomatic hammertoe boney deformities noted bilateral. Muscular strength within normal limits without painon range of motion. No pain with calf compression bilateral.  Assessment and Plan:  Problem List Items Addressed This Visit      Other   Long term current use of anticoagulant    Other Visit Diagnoses    Pain due to onychomycosis of toenails of both feet    -  Primary   Corns and callosities       PAD (peripheral artery disease) ([place])          -Examined patient.  -Re-Discussed treatment options for painful mycotic nails. -Mechanically debrided and reduced mycotic nails with sterile nail nipper and dremel nail file without incident. -Mechanically debrided callus x2 using sterile chisel blade without incident at no additional charge  -Continue with good supportive shoes and foot miracle cream like before to help with callus skin and dryness -Patient to return in 3 months for  follow up evaluation or sooner if symptoms worsen.  Landis Martins, DPM

## 2020-08-15 ENCOUNTER — Ambulatory Visit: Payer: Medicare HMO | Admitting: Gastroenterology

## 2020-08-23 ENCOUNTER — Encounter: Payer: Self-pay | Admitting: Gastroenterology

## 2020-08-23 ENCOUNTER — Ambulatory Visit: Payer: Medicare HMO | Admitting: Gastroenterology

## 2020-08-23 VITALS — BP 146/88 | HR 67 | Ht 72.0 in | Wt 191.2 lb

## 2020-08-23 DIAGNOSIS — K219 Gastro-esophageal reflux disease without esophagitis: Secondary | ICD-10-CM | POA: Diagnosis not present

## 2020-08-23 DIAGNOSIS — K449 Diaphragmatic hernia without obstruction or gangrene: Secondary | ICD-10-CM | POA: Diagnosis not present

## 2020-08-23 MED ORDER — OMEPRAZOLE 20 MG PO CPDR
20.0000 mg | DELAYED_RELEASE_CAPSULE | Freq: Every day | ORAL | 3 refills | Status: DC
Start: 2020-08-23 — End: 2020-11-04

## 2020-08-23 NOTE — Progress Notes (Signed)
Chief Complaint: FU  Referring Provider:  Angelina Sheriff, MD      ASSESSMENT AND PLAN;   #1. GERD with [place] #2. H/O esophageal dysphagia d/t Schatzki's ring s/p EGD with dil 06/2015.  Plan: EGD with dil Feb 2022 off Eliquis x 24 hrs, after cardio clearence. Continue omeprazole 20 mg p.o. QD, 90, 4 refills.  Can try it every other day. If no BM x 3 days, miralax 17gg po qd until good BM Nonpharmacologic means of reflux control Follow-up as needed   HPI:    Terry Villanueva is a 84 y.o. male  For follow-up visit  C/O Occ dysphagia -intermittent throat closing -not very bad x 1 to 2 months. Here for omeprazole refill.  He has been using it every other day at times. Doing well otherwise.  Would like to schedule dilatation in February 2022  No further heartburn.  No nausea, vomiting, heartburn, regurgitation, odynophagia or dysphagia.  No significant diarrhea or constipation (does take Metamucil).  No melena or hematochezia. No unintentional weight loss. No abdominal pain.  Wt Readings from Last 3 Encounters:  08/23/20 191 lb 4 oz (86.8 kg)  01/15/20 188 lb 6.4 oz (85.5 kg)  10/26/19 180 lb (81.6 kg)    Past GI procedures: -EGD 11/2014 Schatzki's ring s/p esophageal dilatation, small hiatal hernia, negative CLO. -Colonoscopy 11/2010: Mild sigmoid diverticulosis, small internal hemorrhoids.  Repeat only if with any new problems or change in family history. Past Medical History:  Diagnosis Date  . Arthritis 02/21/2018  . Carpal tunnel syndrome of left wrist 08/28/2014  . Cervical radiculopathy 08/16/2014  . Erectile dysfunction due to arterial insufficiency 07/24/2015  . Esophageal dysphagia   . Essential hypertension 06/07/2017  . GERD (gastroesophageal reflux disease) 02/21/2018  . Hypothyroidism 02/21/2018  . Long term current use of anticoagulant 12/19/2016  . PAF (paroxysmal atrial fibrillation) ([place]) 06/07/2017  . Polio    1952  . Prostate nodule 07/24/2015  . Sciatica      Past Surgical History:  Procedure Laterality Date  . BACK SURGERY    . COLONOSCOPY  12/08/2010   Mild sigmoid diverticulosis. Small internal hemorrhoids  . ESOPHAGOGASTRODUODENOSCOPY  07/08/2015   Schatzki ring status post esophageal dilatation. Small hiatal hernia  . FLEXIBLE SIGMOIDOSCOPY  1999   Dr Orlena Sheldon  . KNEE SURGERY    . STAPEDES SURGERY    . VASECTOMY    . WRIST SURGERY Left 07/2016   Carpal Tunnel    Family History  Problem Relation Age of Onset  . Cancer Mother   . Diabetes Mother   . Heart disease Mother   . Stroke Mother   . Heart disease Father   . Cancer Brother   . Heart disease Brother   . Hypertension Brother     Social History   Tobacco Use  . Smoking status: Never Smoker  . Smokeless tobacco: Never Used  Vaping Use  . Vaping Use: Never used  Substance Use Topics  . Alcohol use: Not Currently  . Drug use: Not Currently    Current Outpatient Medications  Medication Sig Dispense Refill  . ELIQUIS 5 MG TABS tablet TAKE 1 TABLET BY MOUTH TWICE A DAY 180 tablet 1  . Flaxseed, Linseed, (FLAXSEED OIL MAX STR) 1300 MG CAPS Take 1 tablet by mouth 2 (two) times daily.     Marland Kitchen levothyroxine (SYNTHROID, LEVOTHROID) 50 MCG tablet Take 50 mcg by mouth daily before breakfast.     . metoprolol succinate (TOPROL-XL) 25  MG 24 hr tablet TAKE 1 TABLET BY MOUTH EVERY DAY 90 tablet 2  . Multiple Vitamins-Minerals (MACULAR HEALTH FORMULA PO) Take 1 tablet by mouth 2 (two) times daily.     . Omega-3 Fatty Acids (FISH OIL) 1200 MG CAPS Take 1,200 mg by mouth 2 (two) times daily.    Marland Kitchen omeprazole (PRILOSEC) 20 MG capsule Take 20 mg by mouth every other day.    . vitamin C (ASCORBIC ACID) 500 MG tablet Take 1,000 mg by mouth daily.     Marland Kitchen diltiazem (CARDIZEM) 30 MG tablet TAKE 1 TABLET (30 MG TOTAL) BY MOUTH EVERY 8 (EIGHT) HOURS AS NEEDED (FOR HEART RATE GREATER THAN 110 BPM). (Patient not taking: Reported on 08/23/2020) 90 tablet 5   No current facility-administered  medications for this visit.    No Known Allergies  Review of Systems:  neg     Physical Exam:    BP (!) 146/88   Pulse 67   Ht 6' (1.829 m)   Wt 191 lb 4 oz (86.8 kg)   BMI 25.94 kg/m  Wt Readings from Last 3 Encounters:  08/23/20 191 lb 4 oz (86.8 kg)  01/15/20 188 lb 6.4 oz (85.5 kg)  10/26/19 180 lb (81.6 kg)   Gen: awake, alert, NAD HEENT: anicteric, no pallor CV: RRR, no mrg Pulm: CTA b/l Abd: soft, NT/ND, +BS throughout, small supra-umblical hernia nontender Ext: no c/c/e Neuro: nonfocal    Carmell Austria, MD 08/23/2020, 2:00 PM  Cc: Angelina Sheriff, MD

## 2020-08-23 NOTE — Patient Instructions (Addendum)
If you are age 82 or older, your body mass index should be between 23-30. Your Body mass index is 25.94 kg/m. If this is out of the aforementioned range listed, please consider follow up with your Primary Care Provider.  If you are age 58 or younger, your body mass index should be between 19-25. Your Body mass index is 25.94 kg/m. If this is out of the aformentioned range listed, please consider follow up with your Primary Care Provider.    We will do an EGD with Dilatation in February 2022. After cardiac and eliquis clearance. Off Eliquis for 24 hours Please call the office in several weeks to schedule  Continue with omepraxole 20mg  daiky, you can try taking it every other day  In you have no bowel movements with in 3 days please try 17grams of miralax daily until you have a good bowel movement   Follow up as needed.

## 2020-08-26 ENCOUNTER — Telehealth: Payer: Self-pay | Admitting: Gastroenterology

## 2020-08-26 NOTE — Telephone Encounter (Signed)
Spoke to patient who was seen on 08/23/20 for this same issue. Dr Lyndel Safe recommends to start Miralax until he achieves a good bowel movement then daily. He reports not starting the Miralax yet but will start today and report his response.

## 2020-08-27 DIAGNOSIS — R69 Illness, unspecified: Secondary | ICD-10-CM | POA: Diagnosis not present

## 2020-09-03 ENCOUNTER — Encounter (HOSPITAL_BASED_OUTPATIENT_CLINIC_OR_DEPARTMENT_OTHER): Payer: Self-pay

## 2020-09-03 ENCOUNTER — Emergency Department (HOSPITAL_BASED_OUTPATIENT_CLINIC_OR_DEPARTMENT_OTHER): Payer: Medicare HMO

## 2020-09-03 ENCOUNTER — Other Ambulatory Visit: Payer: Self-pay

## 2020-09-03 ENCOUNTER — Emergency Department (HOSPITAL_BASED_OUTPATIENT_CLINIC_OR_DEPARTMENT_OTHER)
Admission: EM | Admit: 2020-09-03 | Discharge: 2020-09-03 | Disposition: A | Discharge status: Home / Self Care | Payer: Medicare HMO | Attending: Emergency Medicine | Admitting: Emergency Medicine

## 2020-09-03 DIAGNOSIS — S42031A Displaced fracture of lateral end of right clavicle, initial encounter for closed fracture: Secondary | ICD-10-CM | POA: Diagnosis not present

## 2020-09-03 DIAGNOSIS — Z043 Encounter for examination and observation following other accident: Secondary | ICD-10-CM | POA: Diagnosis not present

## 2020-09-03 DIAGNOSIS — R220 Localized swelling, mass and lump, head: Secondary | ICD-10-CM | POA: Diagnosis not present

## 2020-09-03 DIAGNOSIS — S0181XA Laceration without foreign body of other part of head, initial encounter: Secondary | ICD-10-CM | POA: Diagnosis not present

## 2020-09-03 DIAGNOSIS — S0101XA Laceration without foreign body of scalp, initial encounter: Secondary | ICD-10-CM

## 2020-09-03 DIAGNOSIS — Z79899 Other long term (current) drug therapy: Secondary | ICD-10-CM | POA: Insufficient documentation

## 2020-09-03 DIAGNOSIS — S42001A Fracture of unspecified part of right clavicle, initial encounter for closed fracture: Secondary | ICD-10-CM | POA: Insufficient documentation

## 2020-09-03 DIAGNOSIS — E039 Hypothyroidism, unspecified: Secondary | ICD-10-CM | POA: Insufficient documentation

## 2020-09-03 DIAGNOSIS — Z7901 Long term (current) use of anticoagulants: Secondary | ICD-10-CM | POA: Diagnosis not present

## 2020-09-03 DIAGNOSIS — S0003XA Contusion of scalp, initial encounter: Secondary | ICD-10-CM | POA: Diagnosis not present

## 2020-09-03 DIAGNOSIS — W01198A Fall on same level from slipping, tripping and stumbling with subsequent striking against other object, initial encounter: Secondary | ICD-10-CM | POA: Insufficient documentation

## 2020-09-03 DIAGNOSIS — I10 Essential (primary) hypertension: Secondary | ICD-10-CM | POA: Diagnosis not present

## 2020-09-03 DIAGNOSIS — I480 Paroxysmal atrial fibrillation: Secondary | ICD-10-CM | POA: Diagnosis not present

## 2020-09-03 DIAGNOSIS — S01111A Laceration without foreign body of right eyelid and periocular area, initial encounter: Secondary | ICD-10-CM | POA: Diagnosis not present

## 2020-09-03 DIAGNOSIS — S0990XA Unspecified injury of head, initial encounter: Secondary | ICD-10-CM | POA: Diagnosis present

## 2020-09-03 DIAGNOSIS — S01411A Laceration without foreign body of right cheek and temporomandibular area, initial encounter: Secondary | ICD-10-CM | POA: Diagnosis not present

## 2020-09-03 MED ORDER — TRAMADOL HCL 50 MG PO TABS: 50.0000 mg | ORAL_TABLET | Freq: Four times a day (QID) | ORAL | 0 refills | Status: DC | PRN

## 2020-09-03 MED ORDER — LIDOCAINE HCL 2 % IJ SOLN
10.0000 mL | Freq: Once | INTRAMUSCULAR | Status: AC
  Administered 2020-09-03: 200 mg
  Filled 2020-09-03: qty 20

## 2020-09-03 NOTE — ED Provider Notes (Signed)
..Laceration Repair  Date/Time: 09/03/2020 7:35 PM Performed by: Tedd Sias, PA Authorized by: Tedd Sias, PA   Consent:    Consent obtained:  Verbal   Consent given by:  Patient   Risks discussed:  Infection, need for additional repair, pain, poor cosmetic result and poor wound healing   Alternatives discussed:  No treatment and delayed treatment Universal protocol:    Procedure explained and questions answered to patient or proxy's satisfaction: yes     Relevant documents present and verified: yes     Test results available: yes     Imaging studies available: yes     Required blood products, implants, devices, and special equipment available: yes     Site/side marked: yes     Immediately prior to procedure, a time out was called: yes     Patient identity confirmed:  Verbally with patient Anesthesia:    Anesthesia method:  Local infiltration   Local anesthetic:  Lidocaine 2% w/o epi Laceration details:    Location:  Face   Face location:  R cheek   Length (cm):  2 Exploration:    Hemostasis achieved with:  Direct pressure   Wound extent: no foreign bodies/material noted and no tendon damage noted     Contaminated: no   Treatment:    Area cleansed with:  Saline   Amount of cleaning:  Standard   Irrigation solution:  Sterile saline   Irrigation volume:  150   Irrigation method:  Pressure wash   Visualized foreign bodies/material removed: no     Debridement:  None Skin repair:    Repair method:  Sutures   Suture size:  5-0   Suture material:  Prolene   Suture technique:  Simple interrupted   Number of sutures:  2 Approximation:    Approximation:  Close Repair type:    Repair type:  Simple Post-procedure details:    Dressing:  Antibiotic ointment and non-adherent dressing   Procedure completion:  Tolerated well, no immediate complications .Marland KitchenLaceration Repair  Date/Time: 09/03/2020 7:36 PM Performed by: Tedd Sias, PA Authorized by: Tedd Sias, PA   Consent:    Consent obtained:  Verbal   Consent given by:  Patient   Risks discussed:  Infection, need for additional repair, pain, poor cosmetic result and poor wound healing   Alternatives discussed:  No treatment and delayed treatment Universal protocol:    Procedure explained and questions answered to patient or proxy's satisfaction: yes     Relevant documents present and verified: yes     Test results available: yes     Imaging studies available: yes     Required blood products, implants, devices, and special equipment available: yes     Site/side marked: yes     Immediately prior to procedure, a time out was called: yes     Patient identity confirmed:  Verbally with patient Anesthesia:    Anesthesia method:  Local infiltration   Local anesthetic:  Lidocaine 2% w/o epi Laceration details:    Location:  Face   Face location:  R eyebrow   Length (cm):  2 Exploration:    Hemostasis achieved with:  Direct pressure   Wound exploration: wound explored through full range of motion     Wound extent: areolar tissue violated     Wound extent: no foreign bodies/material noted   Treatment:    Area cleansed with:  Saline   Amount of cleaning:  Standard   Irrigation solution:  Sterile saline   Irrigation volume:  150   Irrigation method:  Pressure wash   Visualized foreign bodies/material removed: no     Debridement:  None   Undermining:  None Skin repair:    Repair method:  Sutures   Suture size:  5-0   Suture material:  Prolene   Suture technique:  Simple interrupted and horizontal mattress (One of each)   Number of sutures:  2 Approximation:    Approximation:  Close Repair type:    Repair type:  Simple Post-procedure details:    Dressing:  Antibiotic ointment and non-adherent dressing   Procedure completion:  Tolerated well, no immediate complications      Gailen Shelter, PA 09/03/20 1938    Charlynne Pander, MD 09/03/20 2225

## 2020-09-03 NOTE — ED Notes (Signed)
Patient denies pain and is resting comfortably.  

## 2020-09-03 NOTE — ED Provider Notes (Signed)
[facility] EMERGENCY DEPARTMENT Provider Note   CSN: [id number] Arrival date & time: 09/03/20  1807     History Chief Complaint  Patient presents with  . Fall    Terry Villanueva is a 84 y.o. male history of A. fib on Eliquis, hypertension, here presenting with fall.  He states that his feet got tangled and he tripped and fell and hit the sidewalk.  He states that he noticed some bleeding on the right face.  He denies passing out or chest pain.  Patient also complains of right shoulder pain as well.  Patient states that his tetanus is up-to-date.  The history is provided by the patient.       Past Medical History:  Diagnosis Date  . Arthritis 02/21/2018  . Carpal tunnel syndrome of left wrist 08/28/2014  . Cervical radiculopathy 08/16/2014  . Erectile dysfunction due to arterial insufficiency 07/24/2015  . Esophageal dysphagia   . Essential hypertension 06/07/2017  . GERD (gastroesophageal reflux disease) 02/21/2018  . Hypothyroidism 02/21/2018  . Long term current use of anticoagulant 12/19/2016  . PAF (paroxysmal atrial fibrillation) ([place]) 06/07/2017  . Polio    1952  . Prostate nodule 07/24/2015  . Sciatica     Patient Active Problem List   Diagnosis Date Noted  . GERD (gastroesophageal reflux disease) 02/21/2018  . Hypothyroidism 02/21/2018  . Arthritis 02/21/2018  . PAF (paroxysmal atrial fibrillation) ([place]) 06/07/2017  . Hypertensive heart disease 06/07/2017  . Long term current use of anticoagulant 12/19/2016  . Erectile dysfunction due to arterial insufficiency 07/24/2015  . Prostate nodule 07/24/2015  . Carpal tunnel syndrome of left wrist 08/28/2014  . Cervical radiculopathy 08/16/2014    Past Surgical History:  Procedure Laterality Date  . BACK SURGERY    . COLONOSCOPY  12/08/2010   Mild sigmoid diverticulosis. Small internal hemorrhoids  . ESOPHAGOGASTRODUODENOSCOPY  07/08/2015   Schatzki ring status post esophageal dilatation. Small hiatal hernia   . FLEXIBLE SIGMOIDOSCOPY  1999   Dr Orlena Sheldon  . KNEE SURGERY    . STAPEDES SURGERY    . VASECTOMY    . WRIST SURGERY Left 07/2016   Carpal Tunnel       Family History  Problem Relation Age of Onset  . Cancer Mother   . Diabetes Mother   . Heart disease Mother   . Stroke Mother   . Heart disease Father   . Cancer Brother   . Heart disease Brother   . Hypertension Brother     Social History   Tobacco Use  . Smoking status: Never Smoker  . Smokeless tobacco: Never Used  Vaping Use  . Vaping Use: Never used  Substance Use Topics  . Alcohol use: Not Currently  . Drug use: Not Currently    Home Medications Prior to Admission medications   Medication Sig Start Date End Date Taking? Authorizing Provider  diltiazem (CARDIZEM) 30 MG tablet TAKE 1 TABLET (30 MG TOTAL) BY MOUTH EVERY 8 (EIGHT) HOURS AS NEEDED (FOR HEART RATE GREATER THAN 110 BPM). Patient not taking: Reported on 08/23/2020 01/19/19   Richardo Priest, MD  ELIQUIS 5 MG TABS tablet TAKE 1 TABLET BY MOUTH TWICE A DAY 04/17/20   Munley, Hilton Cork, MD  Flaxseed, Linseed, (FLAXSEED OIL MAX STR) 1300 MG CAPS Take 1 tablet by mouth 2 (two) times daily.     [provider]  levothyroxine (SYNTHROID, LEVOTHROID) 50 MCG tablet Take 50 mcg by mouth daily before breakfast.     [provider]  metoprolol succinate (TOPROL-XL) 25 MG 24 hr tablet TAKE 1 TABLET BY MOUTH EVERY DAY 02/15/20   Baldo DaubMunley, Brian J, MD  Multiple Vitamins-Minerals (MACULAR HEALTH FORMULA PO) Take 1 tablet by mouth 2 (two) times daily.     [provider]  Omega-3 Fatty Acids (FISH OIL) 1200 MG CAPS Take 1,200 mg by mouth 2 (two) times daily.    [provider]  omeprazole (PRILOSEC) 20 MG capsule Take 20 mg by mouth every other day.    [provider]  omeprazole (PRILOSEC) 20 MG capsule Take 1 capsule (20 mg total) by mouth daily. 08/23/20   Lynann BolognaGupta, Rajesh, MD  vitamin C (ASCORBIC ACID) 500 MG tablet Take 1,000 mg by  mouth daily.     [provider]    Allergies    Patient has no known allergies.  Review of Systems   Review of Systems  Musculoskeletal:       R shoulder pain,   Skin: Positive for wound.  All other systems reviewed and are negative.   Physical Exam Updated Vital Signs BP (!) 164/83 (BP Location: Left Arm)   Pulse 70   Temp 98.4 F (36.9 C) (Oral)   Resp 18   Ht 6' (1.829 m)   Wt 81.6 kg   SpO2 99%   BMI 24.41 kg/m   Physical Exam Vitals and nursing note reviewed.  HENT:     Head:     Comments: 2 cm laceration below the right eyebrow and 1 cm laceration above the right eyebrow.  Multiple abrasions on the right face    Nose: Nose normal.     Mouth/Throat:     Mouth: Mucous membranes are moist.  Eyes:     Extraocular Movements: Extraocular movements intact.     Pupils: Pupils are equal, round, and reactive to light.  Neck:     Comments: No obvious midline tenderness Cardiovascular:     Rate and Rhythm: Normal rate and regular rhythm.     Pulses: Normal pulses.     Heart sounds: Normal heart sounds.  Pulmonary:     Effort: Pulmonary effort is normal.     Breath sounds: Normal breath sounds.  Abdominal:     General: Abdomen is flat.     Palpations: Abdomen is soft.  Musculoskeletal:     Cervical back: Normal range of motion.     Comments: Mild right shoulder tenderness but no obvious deformity or dislocation.  Skin:    General: Skin is warm.     Capillary Refill: Capillary refill takes less than 2 seconds.  Neurological:     General: No focal deficit present.     Mental Status: He is alert and oriented to person, place, and time.  Psychiatric:        Mood and Affect: Mood normal.        Behavior: Behavior normal.     ED Results / Procedures / Treatments   Labs (all labs ordered are listed, but only abnormal results are displayed) Labs Reviewed - No data to display  EKG None  Radiology DG Chest 2 View  Result Date: 09/03/2020 CLINICAL  DATA:  Status post fall. EXAM: CHEST - 2 VIEW COMPARISON:  November 29, 2016 FINDINGS: The heart size and mediastinal contours are within normal limits. Both lungs are clear. Degenerative changes seen throughout the thoracic spine. IMPRESSION: No active cardiopulmonary disease. Electronically Signed   By: Aram Candelahaddeus  Houston M.D.   On: 09/03/2020 19:33  DG Shoulder Right  Result Date: 09/03/2020 CLINICAL DATA:  Status post fall. EXAM: RIGHT SHOULDER - 2+ VIEW COMPARISON:  None. FINDINGS: Acute fracture is seen involving the distal aspect of the right clavicle. There is no evidence of dislocation. There is no evidence of arthropathy or other focal bone abnormality. Soft tissues are unremarkable. IMPRESSION: Acute fracture of the distal right clavicle. Electronically Signed   By: Virgina Norfolk M.D.   On: 09/03/2020 19:14   CT Head Wo Contrast  Result Date: 09/03/2020 CLINICAL DATA:  Status post fall. EXAM: CT HEAD WITHOUT CONTRAST TECHNIQUE: Contiguous axial images were obtained from the base of the skull through the vertex without intravenous contrast. COMPARISON:  None. FINDINGS: Brain: There is mild cerebral atrophy with widening of the extra-axial spaces and ventricular dilatation. There are areas of decreased attenuation within the white matter tracts of the supratentorial brain, consistent with microvascular disease changes. Vascular: No hyperdense vessel or unexpected calcification. Skull: Normal. Negative for fracture or focal lesion. Sinuses/Orbits: No acute finding. Other: There is moderate severity right frontal scalp soft tissue swelling. An associated scalp hematoma is noted. IMPRESSION: 1. Moderate severity right frontal scalp soft tissue swelling and associated scalp hematoma. 2. No acute intracranial abnormality. Electronically Signed   By: Virgina Norfolk M.D.   On: 09/03/2020 19:06   CT Cervical Spine Wo Contrast  Result Date: 09/03/2020 CLINICAL DATA:  Status post fall. EXAM: CT  CERVICAL SPINE WITHOUT CONTRAST TECHNIQUE: Multidetector CT imaging of the cervical spine was performed without intravenous contrast. Multiplanar CT image reconstructions were also generated. COMPARISON:  None. FINDINGS: Alignment: Normal. Skull base and vertebrae: No acute fracture. Moderate severity chronic and degenerative changes are seen involving the body and tip of the dens. No primary bone lesion or focal pathologic process. Soft tissues and spinal canal: No prevertebral fluid or swelling. No visible canal hematoma. Disc levels: Moderate severity endplate sclerosis is seen at the levels of C3-C4 and C4-C5 with mild anterior osteophyte formation seen at the levels of C5-C6, C6-C7 and C7-T1. Moderate severity multilevel intervertebral disc space narrowing is seen. Marked severity intervertebral disc space narrowing is noted at the level of C7-T1. Marked severity bilateral multilevel facet joint hypertrophy is noted. Upper chest: Negative. Other: None. IMPRESSION: 1. No acute cervical spine fracture. 2. Marked severity multilevel degenerative changes. Electronically Signed   By: Virgina Norfolk M.D.   On: 09/03/2020 19:14   CT Maxillofacial Wo Contrast  Result Date: 09/03/2020 CLINICAL DATA:  Status post fall. EXAM: CT MAXILLOFACIAL WITHOUT CONTRAST TECHNIQUE: Multidetector CT imaging of the maxillofacial structures was performed. Multiplanar CT image reconstructions were also generated. COMPARISON:  None. FINDINGS: Osseous: No fracture or mandibular dislocation. No destructive process. Orbits: Negative. No traumatic or inflammatory finding. Sinuses: Clear. Soft tissues: There is mild to moderate severity right frontal and lateral right periorbital soft tissue swelling. Limited intracranial: No significant or unexpected finding. IMPRESSION: 1. Mild to moderate severity right frontal and lateral right periorbital soft tissue swelling. 2. No evidence of acute facial bone fracture. Electronically Signed    By: Virgina Norfolk M.D.   On: 09/03/2020 19:09    Procedures Procedures (including critical care time)  Medications Ordered in ED Medications  lidocaine (XYLOCAINE) 2 % (with pres) injection 200 mg (200 mg Other Given by Other 09/03/20 1911)    ED Course  I have reviewed the triage vital signs and the nursing notes.  Pertinent labs & imaging results that were available during my care of the patient were  reviewed by me and considered in my medical decision making (see chart for details).    MDM Rules/Calculators/A&P                         Terry Villanueva is a 84 y.o. male who presenting with fall with facial laceration and right shoulder pain.  Patient is on Eliquis so we will get a CT head neck and face.  Will suture the laceration.  Also get right shoulder x-rays.  7:40 PM X-ray showed right clavicle fracture.  Sutures placed by PA.  Suture removal in a week.  We will also have him follow-up with orthopedic doctor and take tramadol as needed for pain.  He does not want any narcotics  Final Clinical Impression(s) / ED Diagnoses Final diagnoses:  None    Rx / DC Orders ED Discharge Orders    None       Drenda Freeze, MD 09/03/20 1941

## 2020-09-03 NOTE — ED Triage Notes (Signed)
Pt states he tripped and fell in the street hitting his head and landing on his R shoulder. Pt states he is on blood thinners. Pt denies headache. Pt c/o R shoulder pain. Abrasion and laceration noted to R side of face along with some swelling. Pt denies n/v or other symptoms. Pt ambulatory in NAD.

## 2020-09-03 NOTE — Discharge Instructions (Addendum)
You have a fractured clavicle.  Take tramadol as needed for pain.  You can also take Tylenol for mild pain  Follow-up with orthopedic doctor.  Your laceration was sutured and expect to be black and blue.  Your sutures need to come out in 7-10 days  Return to ER if you have worse shoulder pain, headache, vomiting.

## 2020-09-05 DIAGNOSIS — S42001A Fracture of unspecified part of right clavicle, initial encounter for closed fracture: Secondary | ICD-10-CM | POA: Diagnosis not present

## 2020-09-12 DIAGNOSIS — S0181XA Laceration without foreign body of other part of head, initial encounter: Secondary | ICD-10-CM | POA: Diagnosis not present

## 2020-09-12 DIAGNOSIS — S42021A Displaced fracture of shaft of right clavicle, initial encounter for closed fracture: Secondary | ICD-10-CM | POA: Diagnosis not present

## 2020-09-12 DIAGNOSIS — Z6823 Body mass index (BMI) 23.0-23.9, adult: Secondary | ICD-10-CM | POA: Diagnosis not present

## 2020-09-26 DIAGNOSIS — S42001A Fracture of unspecified part of right clavicle, initial encounter for closed fracture: Secondary | ICD-10-CM | POA: Diagnosis not present

## 2020-10-03 DIAGNOSIS — M1711 Unilateral primary osteoarthritis, right knee: Secondary | ICD-10-CM | POA: Diagnosis not present

## 2020-10-03 DIAGNOSIS — M1712 Unilateral primary osteoarthritis, left knee: Secondary | ICD-10-CM | POA: Diagnosis not present

## 2020-10-10 DIAGNOSIS — Z6824 Body mass index (BMI) 24.0-24.9, adult: Secondary | ICD-10-CM | POA: Diagnosis not present

## 2020-10-10 DIAGNOSIS — E782 Mixed hyperlipidemia: Secondary | ICD-10-CM | POA: Diagnosis not present

## 2020-10-10 DIAGNOSIS — Z1331 Encounter for screening for depression: Secondary | ICD-10-CM | POA: Diagnosis not present

## 2020-10-10 DIAGNOSIS — Z0000 Encounter for general adult medical examination without abnormal findings: Secondary | ICD-10-CM | POA: Diagnosis not present

## 2020-10-10 DIAGNOSIS — Z79899 Other long term (current) drug therapy: Secondary | ICD-10-CM | POA: Diagnosis not present

## 2020-10-15 ENCOUNTER — Ambulatory Visit: Payer: Medicare HMO | Admitting: Sports Medicine

## 2020-10-16 ENCOUNTER — Ambulatory Visit: Payer: Medicare HMO | Admitting: Sports Medicine

## 2020-10-16 ENCOUNTER — Other Ambulatory Visit: Payer: Self-pay

## 2020-10-16 ENCOUNTER — Encounter: Payer: Self-pay | Admitting: Sports Medicine

## 2020-10-16 DIAGNOSIS — M79675 Pain in left toe(s): Secondary | ICD-10-CM

## 2020-10-16 DIAGNOSIS — M79674 Pain in right toe(s): Secondary | ICD-10-CM

## 2020-10-16 DIAGNOSIS — B351 Tinea unguium: Secondary | ICD-10-CM

## 2020-10-16 DIAGNOSIS — I739 Peripheral vascular disease, unspecified: Secondary | ICD-10-CM | POA: Diagnosis not present

## 2020-10-16 DIAGNOSIS — L84 Corns and callosities: Secondary | ICD-10-CM

## 2020-10-16 DIAGNOSIS — Z7901 Long term (current) use of anticoagulants: Secondary | ICD-10-CM

## 2020-10-16 NOTE — Progress Notes (Addendum)
Subjective: Terry Villanueva is a 85 y.o. diabetic male patient seen today in office with complaint of mildly painful thickened and elongated toenails and callus; unable to trim. Patient still on Eliquis.  Denies any changes with medications or history since last visit.  Patient Active Problem List   Diagnosis Date Noted  . Impingement syndrome of left shoulder region 09/20/2018  . GERD (gastroesophageal reflux disease) 02/21/2018  . Hypothyroidism 02/21/2018  . Arthritis 02/21/2018  . PAF (paroxysmal atrial fibrillation) ([place]) 06/07/2017  . Hypertensive heart disease 06/07/2017  . Long term current use of anticoagulant 12/19/2016  . Erectile dysfunction due to arterial insufficiency 07/24/2015  . Prostate nodule 07/24/2015  . Carpal tunnel syndrome of left wrist 08/28/2014  . Cervical radiculopathy 08/16/2014    Current Outpatient Medications on File Prior to Visit  Medication Sig Dispense Refill  . diclofenac Sodium (VOLTAREN) 1 % GEL Apply topically.    Marland Kitchen diltiazem (CARDIZEM) 30 MG tablet TAKE 1 TABLET (30 MG TOTAL) BY MOUTH EVERY 8 (EIGHT) HOURS AS NEEDED (FOR HEART RATE GREATER THAN 110 BPM). (Patient not taking: Reported on 08/23/2020) 90 tablet 5  . ELIQUIS 5 MG TABS tablet TAKE 1 TABLET BY MOUTH TWICE A DAY 180 tablet 1  . Flaxseed, Linseed, (FLAXSEED OIL MAX STR) 1300 MG CAPS Take 1 tablet by mouth 2 (two) times daily.     Marland Kitchen levothyroxine (SYNTHROID, LEVOTHROID) 50 MCG tablet Take 50 mcg by mouth daily before breakfast.     . metoprolol succinate (TOPROL-XL) 25 MG 24 hr tablet TAKE 1 TABLET BY MOUTH EVERY DAY 90 tablet 2  . Multiple Vitamins-Minerals (MACULAR HEALTH FORMULA PO) Take 1 tablet by mouth 2 (two) times daily.     . Omega-3 Fatty Acids (FISH OIL) 1200 MG CAPS Take 1,200 mg by mouth 2 (two) times daily.    Marland Kitchen omeprazole (PRILOSEC) 20 MG capsule Take 20 mg by mouth every other day.    Marland Kitchen omeprazole (PRILOSEC) 20 MG capsule Take 1 capsule (20 mg total) by mouth daily. 90  capsule 3  . traMADol (ULTRAM) 50 MG tablet Take 1 tablet (50 mg total) by mouth every 6 (six) hours as needed. 10 tablet 0  . vitamin C (ASCORBIC ACID) 500 MG tablet Take 1,000 mg by mouth daily.      No current facility-administered medications on file prior to visit.    No Known Allergies  Objective: Physical Exam  General: Well developed, nourished, no acute distress, awake, alert and oriented x 3  Vascular: Dorsalis pedis artery 1/4 bilateral, Posterior tibial artery 0/4 bilateral due to trace edema today bilateral, skin temperature warm to warm proximal to distal bilateral lower extremities, moderate varicosities, decreased pedal hair present bilateral.  Neurological: Gross sensation present via light touch bilateral.   Dermatological: Skin is warm, dry, and supple bilateral, Nails 1-10 are tender, long, thick, and discolored with mild subungal debris, no webspace macerations present bilateral, no open lesions present bilateral, callus submet 5 present bilateral. No signs of infection bilateral.  Musculoskeletal: Asymptomatic hammertoe boney deformities noted bilateral. Muscular strength within normal limits without painon range of motion. No pain with calf compression bilateral.  Assessment and Plan:  Problem List Items Addressed This Visit      Other   Long term current use of anticoagulant    Other Visit Diagnoses    Pain due to onychomycosis of toenails of both feet    -  Primary   Corns and callosities       PAD (  peripheral artery disease) ([place])          -Examined patient.  -ABN signed and a copy provided for patient -Re-Discussed treatment options for painful mycotic nails. -Mechanically debrided and reduced mycotic nails with sterile nail nipper and dremel nail file without incident. -Mechanically debrided callus x2 using sterile chisel blade without incident at no additional charge and applied salinocaine and Band-Aid to the left foot -Continue with good  supportive shoes daily for foot type -Encouraged daily skin emollients -Patient to return in 3 months for follow up evaluation or sooner if symptoms worsen.  Landis Martins, DPM

## 2020-10-23 DIAGNOSIS — S42001A Fracture of unspecified part of right clavicle, initial encounter for closed fracture: Secondary | ICD-10-CM | POA: Diagnosis not present

## 2020-10-28 ENCOUNTER — Ambulatory Visit: Payer: Medicare HMO | Admitting: Gastroenterology

## 2020-11-03 ENCOUNTER — Other Ambulatory Visit: Payer: Self-pay | Admitting: Cardiology

## 2020-11-04 ENCOUNTER — Ambulatory Visit: Payer: Medicare HMO | Admitting: Gastroenterology

## 2020-11-04 ENCOUNTER — Ambulatory Visit (HOSPITAL_BASED_OUTPATIENT_CLINIC_OR_DEPARTMENT_OTHER)
Admission: RE | Admit: 2020-11-04 | Discharge: 2020-11-04 | Disposition: A | Discharge status: Home / Self Care | Payer: Medicare HMO | Source: Ambulatory Visit | Attending: Gastroenterology | Admitting: Gastroenterology

## 2020-11-04 ENCOUNTER — Other Ambulatory Visit: Payer: Self-pay

## 2020-11-04 ENCOUNTER — Encounter: Payer: Self-pay | Admitting: Gastroenterology

## 2020-11-04 VITALS — BP 122/72 | HR 63 | Ht 72.0 in | Wt 192.2 lb

## 2020-11-04 DIAGNOSIS — R159 Full incontinence of feces: Secondary | ICD-10-CM | POA: Insufficient documentation

## 2020-11-04 DIAGNOSIS — N3942 Incontinence without sensory awareness: Secondary | ICD-10-CM | POA: Diagnosis not present

## 2020-11-04 NOTE — Progress Notes (Signed)
Chief Complaint: FU  Referring Provider:  Angelina Sheriff, MD      ASSESSMENT AND PLAN;   #1. GERD with [place] #2. H/O esophageal dysphagia d/t Schatzki's ring s/p EGD with dil 06/2015. #3. Rectal incontinence #4.  A. fib on Eliquis  Plan:  Hold flaxseed and fish oil x 2 weeks X ray KUB AAS to r/o high impaction. Call in 2 weeks If still with problems, colon after holding eliquis x 24hrs after cardio clearence. Will also prpceed with EGD with dil at the same time. Continue omeprazole 20 mg p.o. QD.   HPI:    Terry Villanueva is a 85 y.o. male  For follow-up visit  Here with problems with incontinence.  He is not having any diarrhea.  Sometimes he gets constipated.  No abdominal pain.  He is wearing pads.  He denies having any significant upper GI symptoms.  His dysphagia is better.  Hence, he did not go in for EGD previously.  No weight loss.  C/O Occ dysphagia -intermittent throat closing -not very bad x 1 to 2 months. Here for omeprazole refill.  He has been using it every other day at times. Doing well otherwise.  He was scheduled previously for dilation.  He did not show up since he felt better.   Wt Readings from Last 3 Encounters:  11/04/20 192 lb 4 oz (87.2 kg)  09/03/20 180 lb (81.6 kg)  08/23/20 191 lb 4 oz (86.8 kg)    Past GI procedures: -EGD 11/2014 Schatzki's ring s/p esophageal dilatation, small hiatal hernia, negative CLO. -Colonoscopy 11/2010: Mild sigmoid diverticulosis, small internal hemorrhoids.  Repeat only if with any new problems or change in family history. Past Medical History:  Diagnosis Date  . Arthritis 02/21/2018  . Carpal tunnel syndrome of left wrist 08/28/2014  . Cervical radiculopathy 08/16/2014  . Erectile dysfunction due to arterial insufficiency 07/24/2015  . Esophageal dysphagia   . Essential hypertension 06/07/2017  . GERD (gastroesophageal reflux disease) 02/21/2018  . Hypothyroidism 02/21/2018  . Long term current use of  anticoagulant 12/19/2016  . PAF (paroxysmal atrial fibrillation) ([place]) 06/07/2017  . Polio    1952  . Prostate nodule 07/24/2015  . Sciatica     Past Surgical History:  Procedure Laterality Date  . BACK SURGERY    . COLONOSCOPY  12/08/2010   Mild sigmoid diverticulosis. Small internal hemorrhoids  . ESOPHAGOGASTRODUODENOSCOPY  07/08/2015   Schatzki ring status post esophageal dilatation. Small hiatal hernia  . FLEXIBLE SIGMOIDOSCOPY  1999   Dr Orlena Sheldon  . KNEE SURGERY    . STAPEDES SURGERY    . VASECTOMY    . WRIST SURGERY Left 07/2016   Carpal Tunnel    Family History  Problem Relation Age of Onset  . Cancer Mother   . Diabetes Mother   . Heart disease Mother   . Stroke Mother   . Heart disease Father   . Cancer Brother   . Heart disease Brother   . Hypertension Brother     Social History   Tobacco Use  . Smoking status: Never Smoker  . Smokeless tobacco: Never Used  Vaping Use  . Vaping Use: Never used  Substance Use Topics  . Alcohol use: Not Currently  . Drug use: Not Currently    Current Outpatient Medications  Medication Sig Dispense Refill  . ELIQUIS 5 MG TABS tablet TAKE 1 TABLET BY MOUTH TWICE A DAY 180 tablet 1  . Flaxseed, Linseed, (FLAXSEED OIL MAX STR)  1300 MG CAPS Take 1 tablet by mouth 2 (two) times daily.     Marland Kitchen levothyroxine (SYNTHROID, LEVOTHROID) 50 MCG tablet Take 50 mcg by mouth daily before breakfast.     . loratadine (CLARITIN) 10 MG tablet Take 10 mg by mouth daily.    . metoprolol succinate (TOPROL-XL) 25 MG 24 hr tablet TAKE 1 TABLET BY MOUTH EVERY DAY 90 tablet 2  . Multiple Vitamins-Minerals (MACULAR HEALTH FORMULA PO) Take 1 tablet by mouth 2 (two) times daily.     . Omega-3 Fatty Acids (FISH OIL) 1200 MG CAPS Take 1,200 mg by mouth 2 (two) times daily.    Marland Kitchen omeprazole (PRILOSEC) 20 MG capsule Take 20 mg by mouth every other day.    . vitamin C (ASCORBIC ACID) 500 MG tablet Take 1,000 mg by mouth daily.     Marland Kitchen diltiazem (CARDIZEM) 30 MG  tablet TAKE 1 TABLET (30 MG TOTAL) BY MOUTH EVERY 8 (EIGHT) HOURS AS NEEDED (FOR HEART RATE GREATER THAN 110 BPM). (Patient not taking: No sig reported) 90 tablet 5   No current facility-administered medications for this visit.    No Known Allergies  Review of Systems:  neg     Physical Exam:    BP 122/72 (BP Location: Left Arm, Patient Position: Sitting, Cuff Size: Normal)   Pulse 63   Ht 6' (1.829 m)   Wt 192 lb 4 oz (87.2 kg)   BMI 26.07 kg/m  Wt Readings from Last 3 Encounters:  11/04/20 192 lb 4 oz (87.2 kg)  09/03/20 180 lb (81.6 kg)  08/23/20 191 lb 4 oz (86.8 kg)   Gen: awake, alert, NAD HEENT: anicteric, no pallor CV: RRR, no mrg Pulm: CTA b/l Abd: soft, NT/ND, +BS throughout, small supra-umblical hernia nontender Rectal exam: Performed in presence of Tracy.  Good rectal tone.  No external hemorrhoids or lesions.  There was no stool in the rectal vault.  Stool was brown heme-negative. Ext: no c/c/e Neuro: nonfocal    Carmell Austria, MD 11/04/2020, 3:23 PM  Cc: Angelina Sheriff, MD

## 2020-11-04 NOTE — Patient Instructions (Addendum)
If you are age 83 or older, your body mass index should be between 23-30. Your Body mass index is 26.07 kg/m. If this is out of the aforementioned range listed, please consider follow up with your Primary Care Provider.  If you are age 43 or younger, your body mass index should be between 19-25. Your Body mass index is 26.07 kg/m. If this is out of the aformentioned range listed, please consider follow up with your Primary Care Provider.    Please stop on the 1st floor and have your X ray before you leave today.   Stop Flaxseed oil and fish oil for 2 weeks.  Please call Dr. Leland Her nurse Grace Bushy, LPN)  in 2 weeks at [phone]  to let her now how you are doing.    Thank you,  Dr. Jackquline Denmark

## 2020-11-22 ENCOUNTER — Telehealth: Payer: Self-pay | Admitting: Gastroenterology

## 2020-11-22 NOTE — Telephone Encounter (Signed)
Patient is calling to follow up on his condition.

## 2020-11-22 NOTE — Telephone Encounter (Signed)
Spoke to patient who reports since starting Miralax his symptoms have improved. He will call our office if his symptoms change. All questions answered. Patient voiced understanding.

## 2020-11-25 DIAGNOSIS — L821 Other seborrheic keratosis: Secondary | ICD-10-CM | POA: Diagnosis not present

## 2020-11-25 DIAGNOSIS — L570 Actinic keratosis: Secondary | ICD-10-CM | POA: Diagnosis not present

## 2020-11-25 DIAGNOSIS — L720 Epidermal cyst: Secondary | ICD-10-CM | POA: Diagnosis not present

## 2020-11-25 DIAGNOSIS — L814 Other melanin hyperpigmentation: Secondary | ICD-10-CM | POA: Diagnosis not present

## 2020-11-25 DIAGNOSIS — D229 Melanocytic nevi, unspecified: Secondary | ICD-10-CM | POA: Diagnosis not present

## 2020-12-11 DIAGNOSIS — S42001A Fracture of unspecified part of right clavicle, initial encounter for closed fracture: Secondary | ICD-10-CM | POA: Diagnosis not present

## 2021-01-06 DIAGNOSIS — M1711 Unilateral primary osteoarthritis, right knee: Secondary | ICD-10-CM | POA: Diagnosis not present

## 2021-01-06 DIAGNOSIS — M1712 Unilateral primary osteoarthritis, left knee: Secondary | ICD-10-CM | POA: Diagnosis not present

## 2021-01-10 DIAGNOSIS — M25562 Pain in left knee: Secondary | ICD-10-CM | POA: Diagnosis not present

## 2021-01-10 DIAGNOSIS — M25561 Pain in right knee: Secondary | ICD-10-CM | POA: Diagnosis not present

## 2021-01-14 ENCOUNTER — Ambulatory Visit: Payer: Medicare HMO | Admitting: Sports Medicine

## 2021-01-15 ENCOUNTER — Other Ambulatory Visit: Payer: Self-pay

## 2021-01-15 ENCOUNTER — Ambulatory Visit (INDEPENDENT_AMBULATORY_CARE_PROVIDER_SITE_OTHER): Payer: Medicare HMO | Admitting: Gastroenterology

## 2021-01-15 ENCOUNTER — Telehealth: Payer: Self-pay

## 2021-01-15 ENCOUNTER — Encounter: Payer: Self-pay | Admitting: Gastroenterology

## 2021-01-15 VITALS — BP 136/82 | HR 71 | Ht 72.0 in | Wt 184.4 lb

## 2021-01-15 DIAGNOSIS — R159 Full incontinence of feces: Secondary | ICD-10-CM | POA: Diagnosis not present

## 2021-01-15 DIAGNOSIS — Z8719 Personal history of other diseases of the digestive system: Secondary | ICD-10-CM | POA: Diagnosis not present

## 2021-01-15 DIAGNOSIS — R194 Change in bowel habit: Secondary | ICD-10-CM

## 2021-01-15 DIAGNOSIS — J329 Chronic sinusitis, unspecified: Secondary | ICD-10-CM | POA: Diagnosis not present

## 2021-01-15 DIAGNOSIS — K219 Gastro-esophageal reflux disease without esophagitis: Secondary | ICD-10-CM | POA: Diagnosis not present

## 2021-01-15 DIAGNOSIS — Z6824 Body mass index (BMI) 24.0-24.9, adult: Secondary | ICD-10-CM | POA: Diagnosis not present

## 2021-01-15 DIAGNOSIS — J40 Bronchitis, not specified as acute or chronic: Secondary | ICD-10-CM | POA: Diagnosis not present

## 2021-01-15 MED ORDER — NA SULFATE-K SULFATE-MG SULF 17.5-3.13-1.6 GM/177ML PO SOLN: 1.0000 | Freq: Once | ORAL | 0 refills | Status: AC

## 2021-01-15 NOTE — Patient Instructions (Addendum)
If you are age 83 or older, your body mass index should be between 23-30. Your Body mass index is 25.01 kg/m. If this is out of the aforementioned range listed, please consider follow up with your Primary Care Provider.  If you are age 56 or younger, your body mass index should be between 19-25. Your Body mass index is 25.01 kg/m. If this is out of the aformentioned range listed, please consider follow up with your Primary Care Provider.   You have been scheduled for an endoscopy and colonoscopy. Please follow the written instructions given to you at your visit today. Please pick up your prep supplies at the pharmacy within the next 1-3 days. If you use inhalers (even only as needed), please bring them with you on the day of your procedure.  We have sent the following medications to your pharmacy for you to pick up at your convenience: [place] will be contacted by our office prior to your procedure for directions on holding your Eliquis.  If you do not hear from our office 1 week prior to your scheduled procedure, please call [phone] to discuss.   Thank you,  Dr. Jackquline Denmark

## 2021-01-15 NOTE — Progress Notes (Signed)
Chief Complaint: FU  Referring Provider:  Angelina Sheriff, MD      ASSESSMENT AND PLAN;   #1. Change in bowel habits with increasing constipation #2. GERD with HH. H/O eso dysphagia d/t Schatzki's ring s/p EGD with dil 06/2015. #3. Rectal incontinence #4. A. fib on Eliquis  Plan:  EGD with dil/ colon after holding eliquis x 24hrs after cardio clearence from Dr Bettina Gavia.  Continue omeprazole 20 mg p.o. QD. Purne juice Dr Redding's office - recent blood work.  Discussed risks & benefits. Risks including rare perforation req laparotomy, bleeding after bx/polypectomy req blood transfusion, rare risk of splenic trauma, rarely missing neoplasms, risks of anesthesia/sedation. Benefits outweigh the risks. Patient agrees to proceed. All the questions were answered. Consent forms given for review.  HPI:    Terry Villanueva is a 85 y.o. male  For follow-up visit  Here with problems with incontinence.  He is not having any diarrhea.  Sometimes he gets constipated.  No abdominal pain.  He is wearing pads.  Very much concerned about " blockages" in the colon.  Still having dysphagia-intermittent, mostly with solids, more prominently over the last 1 to 2 months.  This is despite omeprazole.  Would like to get EGD and colonoscopy performed.  No weight loss.  Wt Readings from Last 3 Encounters:  01/15/21 184 lb 6 oz (83.6 kg)  11/04/20 192 lb 4 oz (87.2 kg)  09/03/20 180 lb (81.6 kg)    Past GI procedures: -EGD 11/2014 Schatzki's ring s/p esophageal dilatation, small hiatal hernia, negative CLO. -Colonoscopy 11/2010: Mild sigmoid diverticulosis, small internal hemorrhoids.  Repeat only if with any new problems or change in family history. Past Medical History:  Diagnosis Date  . Arthritis 02/21/2018  . Carpal tunnel syndrome of left wrist 08/28/2014  . Cervical radiculopathy 08/16/2014  . Erectile dysfunction due to arterial insufficiency 07/24/2015  . Esophageal dysphagia   .  Essential hypertension 06/07/2017  . GERD (gastroesophageal reflux disease) 02/21/2018  . Hypothyroidism 02/21/2018  . Long term current use of anticoagulant 12/19/2016  . PAF (paroxysmal atrial fibrillation) ([place]) 06/07/2017  . Polio    1952  . Prostate nodule 07/24/2015  . Sciatica     Past Surgical History:  Procedure Laterality Date  . BACK SURGERY    . COLONOSCOPY  12/08/2010   Mild sigmoid diverticulosis. Small internal hemorrhoids  . ESOPHAGOGASTRODUODENOSCOPY  07/08/2015   Schatzki ring status post esophageal dilatation. Small hiatal hernia  . FLEXIBLE SIGMOIDOSCOPY  1999   Dr Orlena Sheldon  . KNEE SURGERY    . STAPEDES SURGERY    . VASECTOMY    . WRIST SURGERY Left 07/2016   Carpal Tunnel    Family History  Problem Relation Age of Onset  . Cancer Mother   . Diabetes Mother   . Heart disease Mother   . Stroke Mother   . Heart disease Father   . Cancer Brother   . Heart disease Brother   . Hypertension Brother     Social History   Tobacco Use  . Smoking status: Never Smoker  . Smokeless tobacco: Never Used  Vaping Use  . Vaping Use: Never used  Substance Use Topics  . Alcohol use: Not Currently  . Drug use: Not Currently    Current Outpatient Medications  Medication Sig Dispense Refill  . diltiazem (CARDIZEM) 30 MG tablet TAKE 1 TABLET (30 MG TOTAL) BY MOUTH EVERY 8 (EIGHT) HOURS AS NEEDED (FOR HEART RATE GREATER THAN 110 BPM). [place]  tablet 5  . ELIQUIS 5 MG TABS tablet TAKE 1 TABLET BY MOUTH TWICE A DAY 180 tablet 1  . levothyroxine (SYNTHROID, LEVOTHROID) 50 MCG tablet Take 50 mcg by mouth daily before breakfast.     . loratadine (CLARITIN) 10 MG tablet Take 10 mg by mouth daily.    . metoprolol succinate (TOPROL-XL) 25 MG 24 hr tablet TAKE 1 TABLET BY MOUTH EVERY DAY 90 tablet 2  . Multiple Vitamins-Minerals (MACULAR HEALTH FORMULA PO) Take 1 tablet by mouth 2 (two) times daily.     Marland Kitchen omeprazole (PRILOSEC) 20 MG capsule Take 20 mg by mouth every other day.    .  vitamin C (ASCORBIC ACID) 500 MG tablet Take 1,000 mg by mouth daily.     . Flaxseed, Linseed, (FLAXSEED OIL MAX STR) 1300 MG CAPS Take 1 tablet by mouth 2 (two) times daily.  (Patient not taking: Reported on 01/15/2021)    . Omega-3 Fatty Acids (FISH OIL) 1200 MG CAPS Take 1,200 mg by mouth 2 (two) times daily. (Patient not taking: Reported on 01/15/2021)     No current facility-administered medications for this visit.    No Known Allergies  Review of Systems:  neg     Physical Exam:    BP 136/82 (BP Location: Left Arm, Patient Position: Sitting, Cuff Size: Normal)   Pulse 71   Ht 6' (1.829 m)   Wt 184 lb 6 oz (83.6 kg)   BMI 25.01 kg/m  Wt Readings from Last 3 Encounters:  01/15/21 184 lb 6 oz (83.6 kg)  11/04/20 192 lb 4 oz (87.2 kg)  09/03/20 180 lb (81.6 kg)   Gen: awake, alert, NAD HEENT: anicteric, no pallor CV: RRR, no mrg Pulm: CTA b/l Abd: soft, NT/ND, +BS throughout, small supra-umblical hernia nontender Rectal exam: To be performed at the time of colonoscopy.  Previous exam showed good rectal tone.  Brown heme-negative stools.   Ext: no c/c/e Neuro: nonfocal    Carmell Austria, MD 01/15/2021, 9:53 AM  Cc: Angelina Sheriff, MD

## 2021-01-15 NOTE — Telephone Encounter (Signed)
   Name: Terry Villanueva  DOB: September 25, 1935  MRN: [id number]   Primary Cardiologist: Shirlee More, MD  Chart reviewed as part of pre-operative protocol coverage. We have been asked for guidance to hold eliquis. Per our clinical pharmacist:  Patient with diagnosis of atrial fibrillation on Eliquis for anticoagulation.    Procedure: EGD/colon Date of procedure: 03/04/21   CHA2DS2-VASc Score = 3  This indicates a 3.2% annual risk of stroke. The patient's score is based upon: CHF History: No HTN History: Yes Diabetes History: No Stroke History: No Vascular Disease History: No Age Score: 2 Gender Score: 0   CrCl 94.6 Platelet count 263  Per office protocol, patient can hold Eliquis for 1 days prior to procedure.   Patient will not need bridging with Lovenox (enoxaparin) around procedure  I will route this recommendation to the requesting party via [facility] fax function and remove from pre-op pool. Please call with questions.  Tami Lin Birdena Kingma, PA 01/15/2021, 2:27 PM

## 2021-01-15 NOTE — Telephone Encounter (Signed)
Patient with diagnosis of atrial fibrillation on Eliquis for anticoagulation.    Procedure: EGD/colon Date of procedure: 03/04/21   CHA2DS2-VASc Score = 3  This indicates a 3.2% annual risk of stroke. The patient's score is based upon: CHF History: No HTN History: Yes Diabetes History: No Stroke History: No Vascular Disease History: No Age Score: 2 Gender Score: 0   CrCl 94.6 Platelet count 263  Per office protocol, patient can hold Eliquis for 1 days prior to procedure.   Patient will not need bridging with Lovenox (enoxaparin) around procedure.

## 2021-01-15 NOTE — Telephone Encounter (Signed)
[facility] Pre-operative Risk Assessment     Request for surgical clearance:     Endoscopy Procedure  What type of surgery is being performed?     EGD/Colon  When is this surgery scheduled?     03/04/2021  What type of clearance is required ?   Pharmacy  Are there any medications that need to be held prior to surgery and how long? Eliquis 24 hour hold  Practice name and name of physician performing surgery?      [facility]  What is your office phone and fax number?      Phone- [phone]  [phone]  Anesthesia type (None, local, MAC, general) ?       MAC

## 2021-01-15 NOTE — Telephone Encounter (Signed)
Patient made aware of this and voiced understanding

## 2021-01-22 ENCOUNTER — Ambulatory Visit: Payer: Medicare HMO | Admitting: Sports Medicine

## 2021-01-30 DIAGNOSIS — Z6824 Body mass index (BMI) 24.0-24.9, adult: Secondary | ICD-10-CM | POA: Diagnosis not present

## 2021-01-30 DIAGNOSIS — J329 Chronic sinusitis, unspecified: Secondary | ICD-10-CM | POA: Diagnosis not present

## 2021-01-30 DIAGNOSIS — J40 Bronchitis, not specified as acute or chronic: Secondary | ICD-10-CM | POA: Diagnosis not present

## 2021-01-31 ENCOUNTER — Other Ambulatory Visit: Payer: Self-pay

## 2021-01-31 ENCOUNTER — Ambulatory Visit: Payer: Medicare HMO | Admitting: Sports Medicine

## 2021-01-31 ENCOUNTER — Encounter: Payer: Self-pay | Admitting: Sports Medicine

## 2021-01-31 DIAGNOSIS — Z7901 Long term (current) use of anticoagulants: Secondary | ICD-10-CM

## 2021-01-31 DIAGNOSIS — I739 Peripheral vascular disease, unspecified: Secondary | ICD-10-CM

## 2021-01-31 DIAGNOSIS — M79674 Pain in right toe(s): Secondary | ICD-10-CM | POA: Diagnosis not present

## 2021-01-31 DIAGNOSIS — L84 Corns and callosities: Secondary | ICD-10-CM

## 2021-01-31 DIAGNOSIS — M79675 Pain in left toe(s): Secondary | ICD-10-CM | POA: Diagnosis not present

## 2021-01-31 DIAGNOSIS — B351 Tinea unguium: Secondary | ICD-10-CM

## 2021-01-31 NOTE — Progress Notes (Signed)
Subjective: Terry Villanueva is a 85 y.o. diabetic male patient seen today in office with complaint of mildly painful thickened and elongated toenails and callus; unable to trim.  Patient reports that he did have to try to trim some of his nails because they grew out long since last visit.  Patient still on Eliquis.  Denies any changes with medications or history since last visit.  Patient is assisted with her son this visit who will also be receiving care.  Patient Active Problem List   Diagnosis Date Noted  . Impingement syndrome of left shoulder region 09/20/2018  . GERD (gastroesophageal reflux disease) 02/21/2018  . Hypothyroidism 02/21/2018  . Arthritis 02/21/2018  . PAF (paroxysmal atrial fibrillation) ([place]) 06/07/2017  . Hypertensive heart disease 06/07/2017  . Long term current use of anticoagulant 12/19/2016  . Erectile dysfunction due to arterial insufficiency 07/24/2015  . Prostate nodule 07/24/2015  . Carpal tunnel syndrome of left wrist 08/28/2014  . Cervical radiculopathy 08/16/2014    Current Outpatient Medications on File Prior to Visit  Medication Sig Dispense Refill  . cefdinir (OMNICEF) 300 MG capsule Take 300 mg by mouth every 12 (twelve) hours.    Marland Kitchen diltiazem (CARDIZEM) 30 MG tablet TAKE 1 TABLET (30 MG TOTAL) BY MOUTH EVERY 8 (EIGHT) HOURS AS NEEDED (FOR HEART RATE GREATER THAN 110 BPM). 90 tablet 5  . ELIQUIS 5 MG TABS tablet TAKE 1 TABLET BY MOUTH TWICE A DAY 180 tablet 1  . Flaxseed, Linseed, (FLAXSEED OIL MAX STR) 1300 MG CAPS Take 1 tablet by mouth 2 (two) times daily.  (Patient not taking: Reported on 01/15/2021)    . levofloxacin (LEVAQUIN) 750 MG tablet Take 750 mg by mouth daily.    Marland Kitchen levothyroxine (SYNTHROID, LEVOTHROID) 50 MCG tablet Take 50 mcg by mouth daily before breakfast.     . loratadine (CLARITIN) 10 MG tablet Take 10 mg by mouth daily.    . metoprolol succinate (TOPROL-XL) 25 MG 24 hr tablet TAKE 1 TABLET BY MOUTH EVERY DAY 90 tablet 2  . Multiple  Vitamins-Minerals (MACULAR HEALTH FORMULA PO) Take 1 tablet by mouth 2 (two) times daily.     . Omega-3 Fatty Acids (FISH OIL) 1200 MG CAPS Take 1,200 mg by mouth 2 (two) times daily. (Patient not taking: Reported on 01/15/2021)    . omeprazole (PRILOSEC) 20 MG capsule Take 20 mg by mouth every other day.    . promethazine-dextromethorphan (PROMETHAZINE-DM) 6.25-15 MG/5ML syrup Take 5 mLs by mouth at bedtime as needed.    . vitamin C (ASCORBIC ACID) 500 MG tablet Take 1,000 mg by mouth daily.      No current facility-administered medications on file prior to visit.    No Known Allergies  Objective: Physical Exam  General: Well developed, nourished, no acute distress, awake, alert and oriented x 3  Vascular: Dorsalis pedis artery 1/4 bilateral, Posterior tibial artery 0/4 bilateral due to trace edema today bilateral, skin temperature warm to warm proximal to distal bilateral lower extremities, moderate varicosities, decreased pedal hair present bilateral.  Neurological: Gross sensation present via light touch bilateral.   Dermatological: Skin is warm, dry, and supple bilateral, Nails 1-10 are tender, long, thick, and discolored with mild subungal debris, no webspace macerations present bilateral, no open lesions present bilateral, callus submet 5 present bilateral. No signs of infection bilateral.  Musculoskeletal: Asymptomatic hammertoe boney deformities noted bilateral. Muscular strength within normal limits without painon range of motion. No pain with calf compression bilateral.  Assessment and Plan:  Problem List Items Addressed This Visit      Other   Long term current use of anticoagulant    Other Visit Diagnoses    Pain due to onychomycosis of toenails of both feet    -  Primary   Relevant Medications   cefdinir (OMNICEF) 300 MG capsule   Corns and callosities       PAD (peripheral artery disease) ([place])          -Examined patient.  -Re-Discussed treatment options for  painful mycotic nails. -Mechanically debrided and reduced mycotic nails with sterile nail nipper and dremel nail file without incident. -Mechanically debrided callus x2 using sterile chisel blade without incident at no additional charge  -Continue with good supportive shoes daily for foot type -Encouraged daily skin emollients as previously recommended -Patient to return in 3 months for follow up evaluation or sooner if symptoms worsen.  Landis Martins, DPM

## 2021-02-07 DIAGNOSIS — Z20828 Contact with and (suspected) exposure to other viral communicable diseases: Secondary | ICD-10-CM | POA: Diagnosis not present

## 2021-02-18 DIAGNOSIS — R1319 Other dysphagia: Secondary | ICD-10-CM | POA: Insufficient documentation

## 2021-02-18 DIAGNOSIS — M5430 Sciatica, unspecified side: Secondary | ICD-10-CM | POA: Insufficient documentation

## 2021-02-18 DIAGNOSIS — A809 Acute poliomyelitis, unspecified: Secondary | ICD-10-CM | POA: Insufficient documentation

## 2021-02-24 ENCOUNTER — Encounter: Payer: Self-pay | Admitting: Gastroenterology

## 2021-02-25 ENCOUNTER — Other Ambulatory Visit: Payer: Self-pay | Admitting: Cardiology

## 2021-02-25 NOTE — Telephone Encounter (Signed)
Refill sent to pharmacy.   Needs appointment for further refills.  

## 2021-03-04 ENCOUNTER — Encounter: Payer: Self-pay | Admitting: Gastroenterology

## 2021-03-04 ENCOUNTER — Encounter: Payer: Medicare HMO | Admitting: Gastroenterology

## 2021-03-04 ENCOUNTER — Ambulatory Visit (AMBULATORY_SURGERY_CENTER): Payer: Medicare HMO | Admitting: Gastroenterology

## 2021-03-04 ENCOUNTER — Other Ambulatory Visit: Payer: Self-pay

## 2021-03-04 VITALS — BP 133/66 | HR 60 | Temp 97.9°F | Resp 15

## 2021-03-04 DIAGNOSIS — R194 Change in bowel habit: Secondary | ICD-10-CM

## 2021-03-04 DIAGNOSIS — K222 Esophageal obstruction: Secondary | ICD-10-CM | POA: Diagnosis not present

## 2021-03-04 DIAGNOSIS — R1310 Dysphagia, unspecified: Secondary | ICD-10-CM | POA: Diagnosis not present

## 2021-03-04 DIAGNOSIS — K317 Polyp of stomach and duodenum: Secondary | ICD-10-CM

## 2021-03-04 DIAGNOSIS — K219 Gastro-esophageal reflux disease without esophagitis: Secondary | ICD-10-CM | POA: Diagnosis not present

## 2021-03-04 DIAGNOSIS — K5730 Diverticulosis of large intestine without perforation or abscess without bleeding: Secondary | ICD-10-CM

## 2021-03-04 DIAGNOSIS — K648 Other hemorrhoids: Secondary | ICD-10-CM | POA: Diagnosis not present

## 2021-03-04 DIAGNOSIS — K449 Diaphragmatic hernia without obstruction or gangrene: Secondary | ICD-10-CM | POA: Diagnosis not present

## 2021-03-04 DIAGNOSIS — Z1211 Encounter for screening for malignant neoplasm of colon: Secondary | ICD-10-CM | POA: Diagnosis not present

## 2021-03-04 MED ORDER — SODIUM CHLORIDE 0.9 % IV SOLN: 500.0000 mL | Freq: Once | INTRAVENOUS | Status: DC

## 2021-03-04 NOTE — Patient Instructions (Signed)
YOU HAD AN ENDOSCOPIC PROCEDURE TODAY AT [facility]:   Refer to the procedure report that was given to you for any specific questions about what was found during the examination.  If the procedure report does not answer your questions, please call your gastroenterologist to clarify.  If you requested that your care partner not be given the details of your procedure findings, then the procedure report has been included in a sealed envelope for you to review at your convenience later.  YOU SHOULD EXPECT: Some feelings of bloating in the abdomen. Passage of more gas than usual.  Walking can help get rid of the air that was put into your GI tract during the procedure and reduce the bloating. If you had a lower endoscopy (such as a colonoscopy or flexible sigmoidoscopy) you may notice spotting of blood in your stool or on the toilet paper. If you underwent a bowel prep for your procedure, you may not have a normal bowel movement for a few days.  Please Note:  You might notice some irritation and congestion in your nose or some drainage.  This is from the oxygen used during your procedure.  There is no need for concern and it should clear up in a day or so.  SYMPTOMS TO REPORT IMMEDIATELY:  Following lower endoscopy (colonoscopy or flexible sigmoidoscopy):  Excessive amounts of blood in the stool  Significant tenderness or worsening of abdominal pains  Swelling of the abdomen that is new, acute  Fever of 100F or higher  Following upper endoscopy (EGD)  Vomiting of blood or coffee ground material  New chest pain or pain under the shoulder blades  Painful or persistently difficult swallowing  New shortness of breath  Fever of 100F or higher  Black, tarry-looking stools  For urgent or emergent issues, a gastroenterologist can be reached at any hour by calling [phone]. Do not use MyChart messaging for urgent concerns.    DIET:  Follow a Post Dilation Diet (see handout):  Nothing by mouth until 3:00 pm, then Clear Liquids only from 3:00 pm to 4:00 pm, then you may proceed to a Soft Diet starting at 4:00 pm for the rest of today. You may proceed to your regular diet tomorrow am as tolerated. Drink plenty of fluids but you should avoid alcoholic beverages for 24 hours. Follow a High Fiber Diet (see handout).  MEDICATIONS: Continue present medications. Resume Eliquis tomorrow at your usual dose.  Please see handouts given to you by your recovery nurse.  Thank you for allowing [place] to provide for your healthcare needs today.  ACTIVITY:  You should plan to take it easy for the rest of today and you should NOT DRIVE or use heavy machinery until tomorrow (because of the sedation medicines used during the test).    FOLLOW UP: Our staff will call the number listed on your records 48-72 hours following your procedure to check on you and address any questions or concerns that you may have regarding the information given to you following your procedure. If we do not reach you, we will leave a message.  We will attempt to reach you two times.  During this call, we will ask if you have developed any symptoms of COVID 19. If you develop any symptoms (ie: fever, flu-like symptoms, shortness of breath, cough etc.) before then, please call [phone].  If you test positive for Covid 19 in the 2 weeks post procedure, please call and report this information to [place].  If any biopsies were taken you will be contacted by phone or by letter within the next 1-3 weeks.  Please call us at [phone] if you have not heard about the biopsies in 3 weeks.    SIGNATURES/CONFIDENTIALITY: You and/or your care partner have signed paperwork which will be entered into your electronic medical record.  These signatures attest to the fact that that the information above on your After Visit Summary has been reviewed and is understood.  Full responsibility of the confidentiality of this discharge  information lies with you and/or your care-partner.

## 2021-03-04 NOTE — Progress Notes (Signed)
Called to room to assist during endoscopic procedure.  Patient ID and intended procedure confirmed with present staff. Received instructions for my participation in the procedure from the performing physician.  

## 2021-03-04 NOTE — Op Note (Signed)
[place] Patient Name: Terry Villanueva Procedure Date: 03/04/2021 1:28 PM MRN: [id number] Endoscopist: Jackquline Denmark , MD Age: 85 Referring MD:  Date of Birth: May 22, 1936 Gender: Male Account #: [id number] Procedure:                Colonoscopy Indications:              Change in bowel habits. Medicines:                Monitored Anesthesia Care Procedure:                Pre-Anesthesia Assessment:                           - Prior to the procedure, a History and Physical                            was performed, and patient medications and                            allergies were reviewed. The patient's tolerance of                            previous anesthesia was also reviewed. The risks                            and benefits of the procedure and the sedation                            options and risks were discussed with the patient.                            All questions were answered, and informed consent                            was obtained. Prior Anticoagulants: The patient has                            taken Eliquis (apixaban), last dose was 1 day prior                            to procedure. ASA Grade Assessment: III - A patient                            with severe systemic disease. After reviewing the                            risks and benefits, the patient was deemed in                            satisfactory condition to undergo the procedure.                           After obtaining informed consent, the colonoscope  was passed under direct vision. Throughout the                            procedure, the patient's blood pressure, pulse, and                            oxygen saturations were monitored continuously. The                            Olympus PFC-[id number] (#[id number]) Colonoscope was                            introduced through the anus and advanced to the 2                            cm into the ileum. The  colonoscopy was performed                            without difficulty. The patient tolerated the                            procedure well. The quality of the bowel                            preparation was good. The ileocecal valve,                            appendiceal orifice, and rectum were photographed. Scope In: 1:41:11 PM Scope Out: 1:52:52 PM Scope Withdrawal Time: 0 hours 8 minutes 23 seconds  Total Procedure Duration: 0 hours 11 minutes 41 seconds  Findings:                 Multiple medium-mouthed diverticula were found in                            the sigmoid colon and descending colon.                           Internal hemorrhoids were found during                            retroflexion. The hemorrhoids were small.                           The terminal ileum appeared normal.                           The exam was otherwise without abnormality on                            direct and retroflexion views. Complications:            No immediate complications. Estimated Blood Loss:     Estimated blood loss: none. Impression:               - Moderate left colonic diverticulosis.                           -  Internal hemorrhoids.                           - The examined portion of the ileum was normal.                           - The examination was otherwise normal on direct                            and retroflexion views.                           - No specimens collected. Recommendation:           - Patient has a contact number available for                            emergencies. The signs and symptoms of potential                            delayed complications were discussed with the                            patient. Return to normal activities tomorrow.                            Written discharge instructions were provided to the                            patient.                           - High fiber diet.                           - Continue present  medications.                           - Repeat colonoscopy is not recommended for                            screening purposes.                           - The findings and recommendations were discussed                            with the patient's family. Jackquline Denmark, MD 03/04/2021 2:02:05 PM This report has been signed electronically.

## 2021-03-04 NOTE — Op Note (Signed)
[place] Patient Name: Terry Villanueva Procedure Date: 03/04/2021 1:28 PM MRN: [id number] Endoscopist: Jackquline Denmark , MD Age: 85 Referring MD:  Date of Birth: Aug 11, 1936 Gender: Male Account #: [id number] Procedure:                Upper GI endoscopy Indications:              Dysphagia Medicines:                Monitored Anesthesia Care Procedure:                Pre-Anesthesia Assessment:                           - Prior to the procedure, a History and Physical                            was performed, and patient medications and                            allergies were reviewed. The patient's tolerance of                            previous anesthesia was also reviewed. The risks                            and benefits of the procedure and the sedation                            options and risks were discussed with the patient.                            All questions were answered, and informed consent                            was obtained. Prior Anticoagulants: The patient has                            taken Eliquis (apixaban), last dose was 1 day prior                            to procedure. ASA Grade Assessment: III - A patient                            with severe systemic disease. After reviewing the                            risks and benefits, the patient was deemed in                            satisfactory condition to undergo the procedure.                           After obtaining informed consent, the endoscope was  passed under direct vision. Throughout the                            procedure, the patient's blood pressure, pulse, and                            oxygen saturations were monitored continuously. The                            Endoscope was introduced through the mouth, and                            advanced to the second part of duodenum. The upper                            GI endoscopy was accomplished without  difficulty.                            The patient tolerated the procedure well. Scope In: Scope Out: Findings:                 A mild Schatzki ring was found at the                            gastroesophageal junction. The scope was withdrawn.                            Dilation was performed with a Maloney dilator with                            mild resistance at 52 Fr.                           A small hiatal hernia was present.                           A few 2 to 4 mm sessile polyps with no bleeding and                            no stigmata of recent bleeding were found in the                            gastric body. Three polyps were removed with a cold                            biopsy forceps. Resection and retrieval were                            complete.                           The examined duodenum was normal. Complications:            No immediate complications. Estimated Blood Loss:     Estimated blood loss: none Impression:               -  Mild Schatzki ring. Dilated.                           - Small hiatal hernia.                           - A few gastric polyps. (Resected and retrieved x 3) Recommendation:           - Patient has a contact number available for                            emergencies. The signs and symptoms of potential                            delayed complications were discussed with the                            patient. Return to normal activities tomorrow.                            Written discharge instructions were provided to the                            patient.                           - Post dilatation diet.                           - Continue present medications.                           - Await pathology results.                           - The findings and recommendations were discussed                            with the patient's family.                           - Resume Eliquis from tomorrow onwards. Jackquline Denmark,  MD 03/04/2021 1:58:12 PM This report has been signed electronically.

## 2021-03-04 NOTE — Progress Notes (Signed)
Pt's states no medical or surgical changes since previsit or office visit. 

## 2021-03-04 NOTE — Progress Notes (Signed)
C.W. vital signs. 

## 2021-03-04 NOTE — Progress Notes (Signed)
A and O x3. Report to RN. Tolerated MAC anesthesia well.Teeth unchanged after procedure. 

## 2021-03-06 ENCOUNTER — Telehealth: Payer: Self-pay

## 2021-03-06 ENCOUNTER — Telehealth: Payer: Self-pay | Admitting: *Deleted

## 2021-03-06 NOTE — Telephone Encounter (Signed)
  Follow up Call-  Call back number 03/04/2021  Post procedure Call Back phone  # [phone]  Permission to leave phone message Yes  Some recent data might be hidden     Patient questions:  Do you have a fever, pain , or abdominal swelling? No. Pain Score  0 *  Have you tolerated food without any problems? Yes.    Have you been able to return to your normal activities? Yes.    Do you have any questions about your discharge instructions: Diet   No. Medications  No. Follow up visit  No.  Do you have questions or concerns about your Care? No.  Actions: * If pain score is 4 or above: No action needed, pain <4.   Have you developed a fever since your procedure? no  2.   Have you had an respiratory symptoms (SOB or cough) since your procedure? no  3.   Have you tested positive for COVID 19 since your procedure no  4.   Have you had any family members/close contacts diagnosed with the COVID 19 since your procedure?  no   If yes to any of these questions please route to Joylene John, RN and Joella Prince, RN

## 2021-03-06 NOTE — Telephone Encounter (Signed)
No answer, unable to leave a message, B.Shakevia Sarris RN. 

## 2021-03-09 NOTE — Progress Notes (Signed)
Cardiology Office Note:    Date:  03/10/2021   ID:  Terry Villanueva, DOB [birth year], MRN [id number]  PCP:  Angelina Sheriff, MD  Cardiologist:  Shirlee More, MD    Referring MD: Angelina Sheriff, MD    ASSESSMENT:    1. PAF (paroxysmal atrial fibrillation) ([place])   2. Essential hypertension   3. Hypertensive heart disease without heart failure   4. Educated about COVID-19 virus infection    PLAN:    In order of problems listed above:  Overall doing well maintaining sinus rhythm continue his anticoagulant beta-blocker and as needed Cardizem for rapid rate if atrial fibrillation recurrence Stable BP at target continue low-dose beta-blocker Advised him to get his second booster 30 days after recovery from his COVID infection   Next appointment: 1 year   Medication Adjustments/Labs and Tests Ordered: Current medicines are reviewed at length with the patient today.  Concerns regarding medicines are outlined above.  Orders Placed This Encounter  Procedures   EKG 12-Lead    No orders of the defined types were placed in this encounter.   Chief Complaint  Patient presents with   Follow-up   Atrial Fibrillation    History of Present Illness:    Terry Villanueva is a 85 y.o. male with a hx of paroxysmal atrial fibrillation edema due to chronic venous insufficiency and asthma last seen 01/15/2020 maintaining sinus rhythm.  Compliance with diet, lifestyle and medications: Yes  He has done well with no recurrence of atrial fibrillation or palpitation. And tolerates his anticoagulant without bleeding complication He had a minor case of COVID recently fully vaccinated and boosted. Most recent labs 10/10/2020 cholesterol 141 triglycerides 75 HDL 35 A1c 5.6% Past Medical History:  Diagnosis Date   Arthritis 02/21/2018   Carpal tunnel syndrome of left wrist 08/28/2014   Cervical radiculopathy 08/16/2014   Erectile dysfunction due to arterial insufficiency 07/24/2015   Esophageal  dysphagia    Essential hypertension 06/07/2017   GERD (gastroesophageal reflux disease) 02/21/2018   Hypothyroidism 02/21/2018   Long term current use of anticoagulant 12/19/2016   PAF (paroxysmal atrial fibrillation) ([place]) 06/07/2017   Polio    1952   Prostate nodule 07/24/2015   Sciatica     Past Surgical History:  Procedure Laterality Date   BACK SURGERY     COLONOSCOPY  12/08/2010   Mild sigmoid diverticulosis. Small internal hemorrhoids   ESOPHAGOGASTRODUODENOSCOPY  07/08/2015   Schatzki ring status post esophageal dilatation. Small hiatal hernia   FLEXIBLE SIGMOIDOSCOPY  1999   Dr [place]don [place]     WRIST SURGERY Left 07/2016   Carpal Tunnel    Current Medications: Current Meds  Medication Sig   diltiazem (CARDIZEM) 30 MG tablet TAKE 1 TABLET (30 MG TOTAL) BY MOUTH EVERY 8 (EIGHT) HOURS AS NEEDED (FOR HEART RATE GREATER THAN 110 BPM).   ELIQUIS 5 MG TABS tablet TAKE 1 TABLET BY MOUTH TWICE A DAY   levothyroxine (SYNTHROID, LEVOTHROID) 50 MCG tablet Take 50 mcg by mouth daily before breakfast.    metoprolol succinate (TOPROL-XL) 25 MG 24 hr tablet TAKE 1 TABLET BY MOUTH EVERY DAY   Multiple Vitamins-Minerals (MACULAR HEALTH FORMULA PO) Take 1 tablet by mouth 2 (two) times daily.    omeprazole (PRILOSEC) 20 MG capsule Take 20 mg by mouth every other day.   vitamin C (ASCORBIC ACID) 500 MG tablet Take 1,000 mg by mouth daily.  Current Facility-Administered Medications for the 03/10/21 encounter (Office Visit) with Richardo Priest, MD  Medication   0.9 %  sodium chloride infusion     Allergies:   Patient has no known allergies.   Social History   Socioeconomic History   Marital status: Married    Spouse name: Not on file   Number of children: Not on file   Years of education: Not on file   Highest education level: Not on file  Occupational History   Not on file  Tobacco Use   Smoking status: Never   Smokeless tobacco:  Never  Vaping Use   Vaping Use: Never used  Substance and Sexual Activity   Alcohol use: Not Currently   Drug use: Not Currently   Sexual activity: Not on file  Other Topics Concern   Not on file  Social History Narrative   Not on file   Social Determinants of Health   Financial Resource Strain: Not on file  Food Insecurity: Not on file  Transportation Needs: Not on file  Physical Activity: Not on file  Stress: Not on file  Social Connections: Not on file     Family History: family history includes Cancer in his brother and mother; Diabetes in his mother; Heart disease in his brother, father, and mother; Hypertension in his brother; Stroke in his mother. ROS:   Please see the history of present illness.    All other systems reviewed and are negative.  EKGs/Labs/Other Studies Reviewed:    The following studies were reviewed today:  EKG:  EKG ordered today and personally reviewed.  The ekg ordered today demonstrates sinus rhythm left axis deviation 1 PVC otherwise normal    Physical Exam:    VS:  BP (!) 102/54 (BP Location: Right Arm, Patient Position: Sitting, Cuff Size: Normal)   Pulse 78   Ht 6' (1.829 m)   Wt 183 lb (83 kg)   BMI 24.82 kg/m     Wt Readings from Last 3 Encounters:  03/10/21 183 lb (83 kg)  01/15/21 184 lb 6 oz (83.6 kg)  11/04/20 192 lb 4 oz (87.2 kg)     GEN:  Well nourished, well developed in no acute distress HEENT: Normal NECK: No JVD; No carotid bruits LYMPHATICS: No lymphadenopathy CARDIAC: RRR, no murmurs, rubs, gallops RESPIRATORY:  Clear to auscultation without rales, wheezing or rhonchi  ABDOMEN: Soft, non-tender, non-distended MUSCULOSKELETAL:  No edema; No deformity  SKIN: Warm and dry NEUROLOGIC:  Alert and oriented x 3 PSYCHIATRIC:  Normal affect    Signed, Shirlee More, MD  03/10/2021 11:54 AM    [place]

## 2021-03-10 ENCOUNTER — Other Ambulatory Visit: Payer: Self-pay | Admitting: Cardiology

## 2021-03-10 ENCOUNTER — Ambulatory Visit: Payer: Medicare HMO | Admitting: Cardiology

## 2021-03-10 ENCOUNTER — Encounter: Payer: Self-pay | Admitting: Cardiology

## 2021-03-10 ENCOUNTER — Other Ambulatory Visit: Payer: Self-pay

## 2021-03-10 ENCOUNTER — Encounter: Payer: Self-pay | Admitting: Gastroenterology

## 2021-03-10 VITALS — BP 102/54 | HR 78 | Ht 72.0 in | Wt 183.0 lb

## 2021-03-10 DIAGNOSIS — I119 Hypertensive heart disease without heart failure: Secondary | ICD-10-CM | POA: Diagnosis not present

## 2021-03-10 DIAGNOSIS — Z7901 Long term (current) use of anticoagulants: Secondary | ICD-10-CM

## 2021-03-10 DIAGNOSIS — I10 Essential (primary) hypertension: Secondary | ICD-10-CM | POA: Diagnosis not present

## 2021-03-10 DIAGNOSIS — I480 Paroxysmal atrial fibrillation: Secondary | ICD-10-CM

## 2021-03-10 DIAGNOSIS — Z7189 Other specified counseling: Secondary | ICD-10-CM

## 2021-03-10 NOTE — Patient Instructions (Signed)

## 2021-03-10 NOTE — Telephone Encounter (Signed)
Pharmacy notified Metoprolol fill to soon.

## 2021-04-14 DIAGNOSIS — H353131 Nonexudative age-related macular degeneration, bilateral, early dry stage: Secondary | ICD-10-CM | POA: Diagnosis not present

## 2021-04-24 DIAGNOSIS — M170 Bilateral primary osteoarthritis of knee: Secondary | ICD-10-CM | POA: Diagnosis not present

## 2021-05-01 DIAGNOSIS — M170 Bilateral primary osteoarthritis of knee: Secondary | ICD-10-CM | POA: Diagnosis not present

## 2021-05-02 ENCOUNTER — Encounter: Payer: Self-pay | Admitting: Sports Medicine

## 2021-05-02 ENCOUNTER — Ambulatory Visit: Payer: Medicare HMO | Admitting: Sports Medicine

## 2021-05-02 ENCOUNTER — Other Ambulatory Visit: Payer: Self-pay

## 2021-05-02 DIAGNOSIS — M79675 Pain in left toe(s): Secondary | ICD-10-CM | POA: Diagnosis not present

## 2021-05-02 DIAGNOSIS — I739 Peripheral vascular disease, unspecified: Secondary | ICD-10-CM

## 2021-05-02 DIAGNOSIS — L84 Corns and callosities: Secondary | ICD-10-CM

## 2021-05-02 DIAGNOSIS — M79674 Pain in right toe(s): Secondary | ICD-10-CM

## 2021-05-02 DIAGNOSIS — B351 Tinea unguium: Secondary | ICD-10-CM

## 2021-05-02 NOTE — Progress Notes (Signed)
Subjective: Terry Villanueva is a 85 y.o. diabetic male patient seen today in office with complaint of mildly painful thickened and elongated toenails and callus; unable to trim.  Patient still on Eliquis.  Denies any changes with medications or history since last visit.  Patient is assisted with her son this visit who will also be receiving care.  Patient Active Problem List   Diagnosis Date Noted   Sciatica    Polio    Esophageal dysphagia    Impingement syndrome of left shoulder region 09/20/2018   GERD (gastroesophageal reflux disease) 02/21/2018   Hypothyroidism 02/21/2018   Arthritis 02/21/2018   PAF (paroxysmal atrial fibrillation) ([place]) 06/07/2017   Hypertensive heart disease 06/07/2017   Essential hypertension 06/07/2017   Long term current use of anticoagulant 12/19/2016   Erectile dysfunction due to arterial insufficiency 07/24/2015   Prostate nodule 07/24/2015   Carpal tunnel syndrome of left wrist 08/28/2014   Cervical radiculopathy 08/16/2014    Current Outpatient Medications on File Prior to Visit  Medication Sig Dispense Refill   diltiazem (CARDIZEM) 30 MG tablet TAKE 1 TABLET (30 MG TOTAL) BY MOUTH EVERY 8 (EIGHT) HOURS AS NEEDED (FOR HEART RATE GREATER THAN 110 BPM). 90 tablet 5   ELIQUIS 5 MG TABS tablet TAKE 1 TABLET BY MOUTH TWICE A DAY 180 tablet 1   Flaxseed, Linseed, (FLAXSEED OIL MAX STR) 1300 MG CAPS Take 1 tablet by mouth 2 (two) times daily.  (Patient not taking: No sig reported)     levothyroxine (SYNTHROID, LEVOTHROID) 50 MCG tablet Take 50 mcg by mouth daily before breakfast.      loratadine (CLARITIN) 10 MG tablet Take 10 mg by mouth daily. (Patient not taking: Reported on 03/10/2021)     metoprolol succinate (TOPROL-XL) 25 MG 24 hr tablet TAKE 1 TABLET BY MOUTH EVERY DAY 90 tablet 3   Multiple Vitamins-Minerals (MACULAR HEALTH FORMULA PO) Take 1 tablet by mouth 2 (two) times daily.      Omega-3 Fatty Acids (FISH OIL) 1200 MG CAPS Take 1,200 mg by mouth 2  (two) times daily. (Patient not taking: No sig reported)     omeprazole (PRILOSEC) 20 MG capsule Take 20 mg by mouth every other day.     vitamin C (ASCORBIC ACID) 500 MG tablet Take 1,000 mg by mouth daily.      Current Facility-Administered Medications on File Prior to Visit  Medication Dose Route Frequency Provider Last Rate Last Admin   0.9 %  sodium chloride infusion  500 mL Intravenous Once Jackquline Denmark, MD        No Known Allergies  Objective: Physical Exam  General: Well developed, nourished, no acute distress, awake, alert and oriented x 3  Vascular: Dorsalis pedis artery 1/4 bilateral, Posterior tibial artery 0/4 bilateral due to trace edema today bilateral, skin temperature warm to warm proximal to distal bilateral lower extremities, moderate varicosities, decreased pedal hair present bilateral.  Neurological: Gross sensation present via light touch bilateral.   Dermatological: Skin is warm, dry, and supple bilateral, Nails 1-10 are tender, long, thick, and discolored with mild subungal debris, no webspace macerations present bilateral, no open lesions present bilateral, callus submet 5 present bilateral. No signs of infection bilateral.  Musculoskeletal: Asymptomatic hammertoe boney deformities noted bilateral. Muscular strength within normal limits without painon range of motion. No pain with calf compression bilateral.  Assessment and Plan:  Problem List Items Addressed This Visit   None Visit Diagnoses     Pain due to onychomycosis of toenails  of both feet    -  Primary   Corns and callosities       PAD (peripheral artery disease) ([place])           -Examined patient.  -Re-Discussed treatment options for painful mycotic nails. -Mechanically debrided and reduced mycotic nails with sterile nail nipper and dremel nail file without incident. -Mechanically debrided callus x2 using sterile chisel blade without incident at no additional charge  -Continue with good  supportive shoes daily for foot type -Encouraged daily skin emollients as previously recommended -Patient to return in 3 months for follow up evaluation or sooner if symptoms worsen.  Landis Martins, DPM

## 2021-05-08 DIAGNOSIS — M170 Bilateral primary osteoarthritis of knee: Secondary | ICD-10-CM | POA: Diagnosis not present

## 2021-05-28 DIAGNOSIS — D492 Neoplasm of unspecified behavior of bone, soft tissue, and skin: Secondary | ICD-10-CM | POA: Diagnosis not present

## 2021-05-28 DIAGNOSIS — L821 Other seborrheic keratosis: Secondary | ICD-10-CM | POA: Diagnosis not present

## 2021-05-28 DIAGNOSIS — L814 Other melanin hyperpigmentation: Secondary | ICD-10-CM | POA: Diagnosis not present

## 2021-05-28 DIAGNOSIS — D225 Melanocytic nevi of trunk: Secondary | ICD-10-CM | POA: Diagnosis not present

## 2021-05-28 DIAGNOSIS — L570 Actinic keratosis: Secondary | ICD-10-CM | POA: Diagnosis not present

## 2021-06-20 DIAGNOSIS — Z23 Encounter for immunization: Secondary | ICD-10-CM | POA: Diagnosis not present

## 2021-06-26 DIAGNOSIS — M170 Bilateral primary osteoarthritis of knee: Secondary | ICD-10-CM | POA: Diagnosis not present

## 2021-07-09 DIAGNOSIS — S81812A Laceration without foreign body, left lower leg, initial encounter: Secondary | ICD-10-CM | POA: Diagnosis not present

## 2021-07-17 DIAGNOSIS — L905 Scar conditions and fibrosis of skin: Secondary | ICD-10-CM | POA: Diagnosis not present

## 2021-07-17 DIAGNOSIS — I519 Heart disease, unspecified: Secondary | ICD-10-CM | POA: Insufficient documentation

## 2021-07-17 DIAGNOSIS — H02812 Retained foreign body in right lower eyelid: Secondary | ICD-10-CM | POA: Diagnosis not present

## 2021-07-17 DIAGNOSIS — S01111S Laceration without foreign body of right eyelid and periocular area, sequela: Secondary | ICD-10-CM | POA: Diagnosis not present

## 2021-07-17 HISTORY — DX: Heart disease, unspecified: I51.9

## 2021-07-21 ENCOUNTER — Other Ambulatory Visit: Payer: Self-pay

## 2021-07-21 ENCOUNTER — Encounter: Payer: Self-pay | Admitting: Gastroenterology

## 2021-07-21 ENCOUNTER — Ambulatory Visit (INDEPENDENT_AMBULATORY_CARE_PROVIDER_SITE_OTHER): Payer: Medicare HMO | Admitting: Gastroenterology

## 2021-07-21 VITALS — BP 132/74 | HR 55 | Ht 72.0 in | Wt 193.0 lb

## 2021-07-21 DIAGNOSIS — R159 Full incontinence of feces: Secondary | ICD-10-CM

## 2021-07-21 DIAGNOSIS — K219 Gastro-esophageal reflux disease without esophagitis: Secondary | ICD-10-CM

## 2021-07-21 NOTE — Patient Instructions (Signed)
If you are age 83 or older, your body mass index should be between 23-30. Your Body mass index is 26.18 kg/m. If this is out of the aforementioned range listed, please consider follow up with your Primary Care Provider.  If you are age 70 or younger, your body mass index should be between 19-25. Your Body mass index is 26.18 kg/m. If this is out of the aformentioned range listed, please consider follow up with your Primary Care Provider.   __________________________________________________________  The [facility] providers would like to encourage you to use [facility] to communicate with providers for non-urgent requests or questions.  Due to long hold times on the telephone, sending your provider a message by [facility] may be a faster and more efficient way to get a response.  Please allow 48 business hours for a response.  Please remember that this is for non-urgent requests.   Increase omeprazole 20 mg daily If still with problems in 4 weeks, add pepcid 20mg  at bedtime. Call if still problems. [phone] It was a pleasure to see you today!  Jackquline Denmark, M.D.

## 2021-07-21 NOTE — Progress Notes (Signed)
Chief Complaint: FU  Referring Provider:  Angelina Sheriff, MD      ASSESSMENT AND PLAN;   #1. GERD with HH. H/O eso dysphagia d/t Schatzki's ring s/p EGD with dil 02/2021  Plan:  Reasonable to increase omeprazole 20 mg p.o. QD If still with problems in 4 weeks, add pepcid 20mg  po QHS Call if still problems.   HPI:    Terry Villanueva is a 85 y.o. male  With H/O A Fib on Eliquis, hypertension, hypothyroidism For follow-up visit  Has been taking omeprazole every other day.  Has few breakthrough symptoms with occasional shortness of breath attributed to reflux.  Has seen cardiology recently- neg eval.  No further dysphagia since dilation.  He denies having any significant diarrhea or constipation.  No abdominal pain.  No weight loss.  Had extensive GI work-up as detailed below.  No weight loss.  Wt Readings from Last 3 Encounters:  07/21/21 193 lb (87.5 kg)  03/10/21 183 lb (83 kg)  01/15/21 184 lb 6 oz (83.6 kg)    Past GI procedures: EGD 02/2021 - Mild Schatzki ring. Dilated. - Small hiatal hernia. - A few gastric polyps. (Resected and retrieved x 3)  Colonoscopy 02/2021 - Moderate left colonic diverticulosis. - Internal hemorrhoids. - The examined portion of the ileum was normal. - The examination was otherwise normal on direct and retroflexion views. - No specimens collected.  -EGD 11/2014 Schatzki's ring s/p esophageal dilatation, small hiatal hernia, negative CLO. -Colonoscopy 11/2010: Mild sigmoid div Past Medical History:  Diagnosis Date   Arthritis 02/21/2018   Carpal tunnel syndrome of left wrist 08/28/2014   Cervical radiculopathy 08/16/2014   Erectile dysfunction due to arterial insufficiency 07/24/2015   Esophageal dysphagia    Essential hypertension 06/07/2017   GERD (gastroesophageal reflux disease) 02/21/2018   Hypothyroidism 02/21/2018   Long term current use of anticoagulant 12/19/2016   PAF (paroxysmal atrial fibrillation) ([place]) 06/07/2017    Polio    1952   Prostate nodule 07/24/2015   Sciatica     Past Surgical History:  Procedure Laterality Date   BACK SURGERY     COLONOSCOPY  12/08/2010   Mild sigmoid diverticulosis. Small internal hemorrhoids   ESOPHAGOGASTRODUODENOSCOPY  07/08/2015   Schatzki ring status post esophageal dilatation. Small hiatal hernia   FLEXIBLE SIGMOIDOSCOPY  1999   Dr Orlena Sheldon   KNEE SURGERY     STAPEDES SURGERY     VASECTOMY     WRIST SURGERY Left 07/2016   Carpal Tunnel    Family History  Problem Relation Age of Onset   Cancer Mother    Diabetes Mother    Heart disease Mother    Stroke Mother    Heart disease Father    Cancer Brother    Heart disease Brother    Hypertension Brother    Colon cancer Neg Hx    Rectal cancer Neg Hx    Esophageal cancer Neg Hx     Social History   Tobacco Use   Smoking status: Never   Smokeless tobacco: Never  Vaping Use   Vaping Use: Never used  Substance Use Topics   Alcohol use: Not Currently   Drug use: Not Currently    Current Outpatient Medications  Medication Sig Dispense Refill   diltiazem (CARDIZEM) 30 MG tablet TAKE 1 TABLET (30 MG TOTAL) BY MOUTH EVERY 8 (EIGHT) HOURS AS NEEDED (FOR HEART RATE GREATER THAN 110 BPM). 90 tablet 5   ELIQUIS 5 MG TABS tablet TAKE  1 TABLET BY MOUTH TWICE A DAY 180 tablet 1   levothyroxine (SYNTHROID, LEVOTHROID) 50 MCG tablet Take 50 mcg by mouth daily before breakfast.      metoprolol succinate (TOPROL-XL) 25 MG 24 hr tablet TAKE 1 TABLET BY MOUTH EVERY DAY 90 tablet 3   Multiple Vitamins-Minerals (MACULAR HEALTH FORMULA PO) Take 1 tablet by mouth 2 (two) times daily.      omeprazole (PRILOSEC) 20 MG capsule Take 20 mg by mouth every other day.     vitamin C (ASCORBIC ACID) 500 MG tablet Take 1,000 mg by mouth daily.      Current Facility-Administered Medications  Medication Dose Route Frequency Provider Last Rate Last Admin   0.9 %  sodium chloride infusion  500 mL Intravenous Once Jackquline Denmark, MD         No Known Allergies  Review of Systems:  neg     Physical Exam:    BP 132/74   Pulse (!) 55   Ht 6' (1.829 m)   Wt 193 lb (87.5 kg)   SpO2 98%   BMI 26.18 kg/m  Wt Readings from Last 3 Encounters:  07/21/21 193 lb (87.5 kg)  03/10/21 183 lb (83 kg)  01/15/21 184 lb 6 oz (83.6 kg)   Gen: awake, alert, NAD HEENT: anicteric, no pallor CV: RRR, no mrg Pulm: CTA b/l Abd: soft, NT/ND, +BS throughout, small supra-umblical hernia nontender. Ext: no c/c/e Neuro: nonfocal    Carmell Austria, MD 07/21/2021, 9:38 AM  Cc: Angelina Sheriff, MD

## 2021-07-23 DIAGNOSIS — Z23 Encounter for immunization: Secondary | ICD-10-CM | POA: Diagnosis not present

## 2021-07-31 DIAGNOSIS — Z6825 Body mass index (BMI) 25.0-25.9, adult: Secondary | ICD-10-CM | POA: Diagnosis not present

## 2021-07-31 DIAGNOSIS — I4891 Unspecified atrial fibrillation: Secondary | ICD-10-CM | POA: Diagnosis not present

## 2021-07-31 DIAGNOSIS — R600 Localized edema: Secondary | ICD-10-CM | POA: Diagnosis not present

## 2021-07-31 DIAGNOSIS — S81819A Laceration without foreign body, unspecified lower leg, initial encounter: Secondary | ICD-10-CM | POA: Diagnosis not present

## 2021-08-05 ENCOUNTER — Ambulatory Visit: Payer: Medicare HMO | Admitting: Sports Medicine

## 2021-08-14 ENCOUNTER — Other Ambulatory Visit: Payer: Self-pay | Admitting: Cardiology

## 2021-08-14 NOTE — Telephone Encounter (Signed)
Prescription refill request for Eliquis received. Indication:Afib Last office visit:6/22 Scr:0.7 Age: 85 Weight:87.5 kg  Prescription refilled

## 2021-08-29 ENCOUNTER — Ambulatory Visit: Payer: Medicare HMO | Admitting: Sports Medicine

## 2021-08-29 ENCOUNTER — Encounter: Payer: Self-pay | Admitting: Sports Medicine

## 2021-08-29 DIAGNOSIS — M79674 Pain in right toe(s): Secondary | ICD-10-CM

## 2021-08-29 DIAGNOSIS — B351 Tinea unguium: Secondary | ICD-10-CM | POA: Diagnosis not present

## 2021-08-29 DIAGNOSIS — M79675 Pain in left toe(s): Secondary | ICD-10-CM

## 2021-08-29 DIAGNOSIS — L84 Corns and callosities: Secondary | ICD-10-CM

## 2021-08-29 DIAGNOSIS — I739 Peripheral vascular disease, unspecified: Secondary | ICD-10-CM

## 2021-08-29 DIAGNOSIS — Z7901 Long term (current) use of anticoagulants: Secondary | ICD-10-CM

## 2021-08-29 NOTE — Progress Notes (Signed)
Subjective: Saleh Ulbrich is a 85 y.o. diabetic male patient seen today in office with complaint of mildly painful thickened and elongated toenails and callus; unable to trim.  Patient still on Eliquis.  Denies any changes with medications or history since last visit.  No other issues noted.  Patient is assisted with her son this visit who will also be receiving care.  Patient Active Problem List   Diagnosis Date Noted   Heart disease 07/17/2021   Sciatica    Polio    Esophageal dysphagia    Impingement syndrome of left shoulder region 09/20/2018   GERD (gastroesophageal reflux disease) 02/21/2018   Hypothyroidism 02/21/2018   Arthritis 02/21/2018   PAF (paroxysmal atrial fibrillation) ([place]) 06/07/2017   Hypertensive heart disease 06/07/2017   Essential hypertension 06/07/2017   Long term current use of anticoagulant 12/19/2016   Erectile dysfunction due to arterial insufficiency 07/24/2015   Prostate nodule 07/24/2015   Carpal tunnel syndrome of left wrist 08/28/2014   Cervical radiculopathy 08/16/2014    Current Outpatient Medications on File Prior to Visit  Medication Sig Dispense Refill   Ascorbic Acid 500 MG CHEW Chew by mouth.     Cephalexin 500 MG tablet Take 500 mg by mouth 2 (two) times daily.     diltiazem (CARDIZEM) 30 MG tablet TAKE 1 TABLET (30 MG TOTAL) BY MOUTH EVERY 8 (EIGHT) HOURS AS NEEDED (FOR HEART RATE GREATER THAN 110 BPM). 90 tablet 5   ELIQUIS 5 MG TABS tablet TAKE 1 TABLET BY MOUTH TWICE A DAY 180 tablet 1   levothyroxine (SYNTHROID) 50 MCG tablet levothyroxine 50 mcg tablet   1 tablet every day by oral route.     levothyroxine (SYNTHROID, LEVOTHROID) 50 MCG tablet Take 50 mcg by mouth daily before breakfast.      metoprolol succinate (TOPROL-XL) 25 MG 24 hr tablet TAKE 1 TABLET BY MOUTH EVERY DAY 90 tablet 3   Multiple Vitamins-Minerals (MACULAR HEALTH FORMULA PO) Take 1 tablet by mouth 2 (two) times daily.      omeprazole (PRILOSEC) 20 MG capsule Take 20  mg by mouth every other day.     oseltamivir (TAMIFLU) 75 MG capsule Take 75 mg by mouth daily.     vitamin C (ASCORBIC ACID) 500 MG tablet Take 1,000 mg by mouth daily.      Current Facility-Administered Medications on File Prior to Visit  Medication Dose Route Frequency Provider Last Rate Last Admin   0.9 %  sodium chloride infusion  500 mL Intravenous Once Jackquline Denmark, MD        No Known Allergies  Objective: Physical Exam  General: Well developed, nourished, no acute distress, awake, alert and oriented x 3  Vascular: Dorsalis pedis artery 1/4 bilateral, Posterior tibial artery 0/4 bilateral due to trace edema today bilateral, skin temperature warm to warm proximal to distal bilateral lower extremities, moderate varicosities, decreased pedal hair present bilateral.  Neurological: Gross sensation present via light touch bilateral.   Dermatological: Skin is warm, dry, and supple bilateral, Nails 1-10 are tender, long, thick, and discolored with mild subungal debris, no webspace macerations present bilateral, no open lesions present bilateral, callus submet 5 present bilateral. No signs of infection bilateral.  Musculoskeletal: Asymptomatic hammertoe boney deformities noted bilateral. Muscular strength within normal limits without painon range of motion. No pain with calf compression bilateral.  Assessment and Plan:  Problem List Items Addressed This Visit       Other   Long term current use of anticoagulant  Other Visit Diagnoses     Pain due to onychomycosis of toenails of both feet    -  Primary   Relevant Medications   Cephalexin 500 MG tablet   oseltamivir (TAMIFLU) 75 MG capsule   Corns and callosities       PAD (peripheral artery disease) ([place])           -Examined patient.  -Re-Discussed treatment options for painful mycotic nails. -Mechanically debrided and reduced mycotic nails with sterile nail nipper and dremel nail file without incident. -Mechanically  smoothed callus x2 using rotary Dremel without incident at no additional charge  -Continue with good supportive shoes daily for foot type -Encouraged daily skin emollients as previously recommended -Patient to return in 3 months for follow up evaluation or sooner if symptoms worsen.  Landis Martins, DPM

## 2021-10-01 ENCOUNTER — Other Ambulatory Visit: Payer: Self-pay | Admitting: Gastroenterology

## 2021-10-14 DIAGNOSIS — Z79899 Other long term (current) drug therapy: Secondary | ICD-10-CM | POA: Diagnosis not present

## 2021-10-14 DIAGNOSIS — M8580 Other specified disorders of bone density and structure, unspecified site: Secondary | ICD-10-CM | POA: Diagnosis not present

## 2021-10-14 DIAGNOSIS — Z1331 Encounter for screening for depression: Secondary | ICD-10-CM | POA: Diagnosis not present

## 2021-10-14 DIAGNOSIS — Z6824 Body mass index (BMI) 24.0-24.9, adult: Secondary | ICD-10-CM | POA: Diagnosis not present

## 2021-10-14 DIAGNOSIS — E782 Mixed hyperlipidemia: Secondary | ICD-10-CM | POA: Diagnosis not present

## 2021-10-14 DIAGNOSIS — Z0000 Encounter for general adult medical examination without abnormal findings: Secondary | ICD-10-CM | POA: Diagnosis not present

## 2021-11-20 DIAGNOSIS — H8092 Unspecified otosclerosis, left ear: Secondary | ICD-10-CM | POA: Diagnosis not present

## 2021-11-20 DIAGNOSIS — H6123 Impacted cerumen, bilateral: Secondary | ICD-10-CM | POA: Diagnosis not present

## 2021-11-20 DIAGNOSIS — H9190 Unspecified hearing loss, unspecified ear: Secondary | ICD-10-CM | POA: Diagnosis not present

## 2021-11-25 DIAGNOSIS — L309 Dermatitis, unspecified: Secondary | ICD-10-CM | POA: Diagnosis not present

## 2021-11-25 DIAGNOSIS — L570 Actinic keratosis: Secondary | ICD-10-CM | POA: Diagnosis not present

## 2021-11-28 ENCOUNTER — Encounter: Payer: Self-pay | Admitting: Sports Medicine

## 2021-11-28 ENCOUNTER — Ambulatory Visit: Payer: Medicare HMO | Admitting: Sports Medicine

## 2021-11-28 ENCOUNTER — Other Ambulatory Visit: Payer: Self-pay

## 2021-11-28 DIAGNOSIS — M79674 Pain in right toe(s): Secondary | ICD-10-CM

## 2021-11-28 DIAGNOSIS — I739 Peripheral vascular disease, unspecified: Secondary | ICD-10-CM

## 2021-11-28 DIAGNOSIS — M216X9 Other acquired deformities of unspecified foot: Secondary | ICD-10-CM

## 2021-11-28 DIAGNOSIS — M25372 Other instability, left ankle: Secondary | ICD-10-CM

## 2021-11-28 DIAGNOSIS — M79675 Pain in left toe(s): Secondary | ICD-10-CM

## 2021-11-28 DIAGNOSIS — R269 Unspecified abnormalities of gait and mobility: Secondary | ICD-10-CM

## 2021-11-28 DIAGNOSIS — Z7901 Long term (current) use of anticoagulants: Secondary | ICD-10-CM

## 2021-11-28 DIAGNOSIS — L84 Corns and callosities: Secondary | ICD-10-CM

## 2021-11-28 DIAGNOSIS — B351 Tinea unguium: Secondary | ICD-10-CM

## 2021-11-28 DIAGNOSIS — A809 Acute poliomyelitis, unspecified: Secondary | ICD-10-CM

## 2021-11-28 NOTE — Progress Notes (Signed)
Subjective: ?Terry Villanueva is a 86 y.o. diabetic male patient seen today in office with complaint of mildly painful thickened and elongated toenails and callus; unable to trim.  Reports that he is having trouble with his left ankle and foot turning inward states that he has orthotics and its been over 2 years she is not sure if he needs new ones and states occasionally sometimes due to his foot rubbing the callus on the bottom of the left foot is painful.  Patient still on Eliquis as previously noted.  Denies any changes with medications or history since last visit.  No other issues noted. ? ?Patient is assisted with her son this visit who will also be receiving care. ? ?Patient Active Problem List  ? Diagnosis Date Noted  ? Heart disease 07/17/2021  ? Sciatica   ? Polio   ? Esophageal dysphagia   ? Impingement syndrome of left shoulder region 09/20/2018  ? GERD (gastroesophageal reflux disease) 02/21/2018  ? Hypothyroidism 02/21/2018  ? Arthritis 02/21/2018  ? PAF (paroxysmal atrial fibrillation) ([place]) 06/07/2017  ? Hypertensive heart disease 06/07/2017  ? Essential hypertension 06/07/2017  ? Long term current use of anticoagulant 12/19/2016  ? Erectile dysfunction due to arterial insufficiency 07/24/2015  ? Prostate nodule 07/24/2015  ? Carpal tunnel syndrome of left wrist 08/28/2014  ? Cervical radiculopathy 08/16/2014  ? ? ?Current Outpatient Medications on File Prior to Visit  ?Medication Sig Dispense Refill  ? Ascorbic Acid 500 MG CHEW Chew by mouth.    ? diltiazem (CARDIZEM) 30 MG tablet TAKE 1 TABLET (30 MG TOTAL) BY MOUTH EVERY 8 (EIGHT) HOURS AS NEEDED (FOR HEART RATE GREATER THAN 110 BPM). 90 tablet 5  ? ELIQUIS 5 MG TABS tablet TAKE 1 TABLET BY MOUTH TWICE A DAY 180 tablet 1  ? levothyroxine (SYNTHROID) 50 MCG tablet levothyroxine 50 mcg tablet ?  1 tablet every day by oral route.    ? metoprolol succinate (TOPROL-XL) 25 MG 24 hr tablet TAKE 1 TABLET BY MOUTH EVERY DAY 90 tablet 3  ? Multiple  Vitamins-Minerals (MACULAR HEALTH FORMULA PO) Take 1 tablet by mouth 2 (two) times daily.     ? omeprazole (PRILOSEC) 20 MG capsule TAKE 1 CAPSULE BY MOUTH EVERY DAY 90 capsule 3  ? triamcinolone cream (KENALOG) 0.1 % Apply topically 2 (two) times daily as needed.    ? vitamin C (ASCORBIC ACID) 500 MG tablet Take 1,000 mg by mouth daily.     ? ?Current Facility-Administered Medications on File Prior to Visit  ?Medication Dose Route Frequency Provider Last Rate Last Admin  ? 0.9 %  sodium chloride infusion  500 mL Intravenous Once Jackquline Denmark, MD      ? ? ?No Known Allergies ? ?Objective: ?Physical Exam ? ?General: Well developed, nourished, no acute distress, awake, alert and oriented x 3 ? ?Vascular: Dorsalis pedis artery 1/4 bilateral, Posterior tibial artery 0/4 bilateral due to trace edema today bilateral, skin temperature warm to warm proximal to distal bilateral lower extremities, moderate varicosities, decreased pedal hair present bilateral. ? ?Neurological: Gross sensation present via light touch bilateral.  ? ?Dermatological: Skin is warm, dry, and supple bilateral, Nails 1-10 are tender, long, thick, and discolored with mild subungal debris, no webspace macerations present bilateral, no open lesions present bilateral, callus submet 5 present bilateral worse on the left. No signs of infection bilateral. ? ?Musculoskeletal: There is laxity noted at the left lateral ankle with cavovarus foot type and foot deformity with history of polio.  Asymptomatic hammertoe boney deformities noted bilateral. Muscular strength within normal limits without painon range of motion however there is concerning instability/abnormality in gait patient feels like his left foot is supinating on his orthotic. No pain with calf compression bilateral. ? ?Assessment and Plan:  ?Problem List Items Addressed This Visit   ? ?  ? Nervous and Auditory  ? Polio  ?  ? Other  ? Long term current use of anticoagulant  ? ?Other Visit Diagnoses    ? ? Pain due to onychomycosis of toenails of both feet    -  Primary  ? Corns and callosities      ? PAD (peripheral artery disease) ([place])      ? Callus of foot      ? Cavus deformity of foot, acquired      ? Ankle instability, left      ? Abnormality of gait      ? ?  ? ? ?-Examined patient.  ?-Discussed with patient foot deformity with instability in supination on the left ?-Recommend at this time for patient to see Aaron Edelman and I advised him that likely he would do better with bracing instead of an orthotic; patient is agreeable to this and wants to see Aaron Edelman as a consult to see what options he has for bracing ?-Re-Discussed treatment options for painful mycotic nails. ?-Mechanically debrided and reduced all painful mycotic nails with sterile nail nipper and dremel nail file without incident. ?-Mechanically smoothed callus x2 using rotary Dremel without incident at no additional charge  ?-Continue with good supportive shoes daily for foot type until he can be evaluated by Aaron Edelman for brace on the left ?-Encouraged daily skin emollients as previously recommended ?-Patient to return in 3 months with Dr. Blenda Mounts for follow up evaluation or sooner if symptoms worsen. ? ?Landis Martins, DPM ? ?

## 2021-12-04 ENCOUNTER — Other Ambulatory Visit: Payer: Self-pay

## 2021-12-04 ENCOUNTER — Ambulatory Visit: Payer: Medicare HMO

## 2021-12-04 DIAGNOSIS — A809 Acute poliomyelitis, unspecified: Secondary | ICD-10-CM

## 2021-12-04 DIAGNOSIS — M25372 Other instability, left ankle: Secondary | ICD-10-CM

## 2021-12-04 DIAGNOSIS — R269 Unspecified abnormalities of gait and mobility: Secondary | ICD-10-CM

## 2021-12-04 NOTE — Progress Notes (Signed)
SITUATION ?Patient Name:  Terry Villanueva ?MRN:   [id number] ?Reason for Visit: Evaluation for [facility] Gauntlet AFO ? ?Patient Report: ?Chief Complaint:   Ankle Instability ?Nature of Discomfort/Pain:  Ambulatory ?Location:    left lower extremity ?Onset & Duration:   Gradual and Present longer than 3 months ?Course:    gradually worsening ?Aggravating or Alleviating Factors: Ambulation ? ?OBJECTIVE DATA & MEASUREMENTS ?Prognosis:    Good ?Duration of use:   5 years ? ?Diagnosis: ?  ICD-10-CM   ?1. Ankle instability, left  M25.372   ?  ?2. Abnormality of gait  R26.9   ?  ?3. Polio  A80.9   ?  ? ? ?GOALS, NECESSITIES, & JUSTIFICATIONS ?Recommended Device: [employer] ?Color:    Black ?Closure:   Velcro ? ?Laterality HCPCS Code Description Justification  ?left [id number] Plastic orthosis, custom molded from a model of the patient, custom fabricated, includes casting and cast preparation. Necessary to provide triplanar support to the foot/ankle complex  ?left L2200 Varus/Valgus control of plastic orthosis Necessary for medolateral control of foot/ankle complex  ?left L2330 Addition to lower extremity, lacer molded to patient model Necessary to ensure secure hold of orthosis to patient's limb  ?left L2820 Addition to lower extremity orthosis, soft interface for molded plastic below knee section Necessary to relieve pressure on bony prominences  ? ? ?I certify that Terry Villanueva qualifies for and will benefit from an ankle foot orthosis used during ambulation based on meeting all of the following criteria;  ? ?The patient is: ?- Ambulatory, and ?- Has weakness or deformity of the foot and ankle, and ?- Requires stabilization for medical reasons, and ?- Has the potential to benefit functionally ? ?The patient?s medical record contains sufficient documentation of the patients medical condition to substantiate the necessity for the type and quantity of the items ordered. ? ?The goals of this therapy: ?- Improve Mobility ?-  Improve Lower Extremity Stability ?- Decrease Pain ?- Facilitate Soft Tissue Healing ?- Facilitate Immobilization, healing and treatment of an injury ? ?Necessity of Ankle Foot Orthotic molded to patient model: ?A custom (vs. prefabricated) ankle foot orthosis has been prescribed based on the following criteria which are specific to the condition of this patient; ?- The patient could not be fit with a prefabricated AFO ?- The condition necessitating the orthosis is expected to be permanent or of longstanding duration (more than 6 months) ?- There is need to control the ankle or foot in more than one plane ?- The patient has a documented neurological, circulatory, or orthopedic condition that requires custom fabrication over a model to prevent tissue injury ?- The patient has a healing fracture that lacks normal anatomical integrity or anthropometric proportions ? ?I hereby certify that the ankle foot orthotic described above is a rigid or semi-rigid device which is used for the purpose of supporting a weak or deformed body member or restricting or eliminating motion in a diseased or injured part of the body. It is designed to provide support and counterforce on the limb or body part that is being braced. In my opinion, the custom molded ankle foot orthosis is both reasonable and necessary in reference to accepted standards of medical practice in the treatment  ?of the patient condition and rehabilitation. ? ?ACTIONS PERFORMED ?Patient was evaluated and casted for [place] AFO via [place]. Procedure was explained to patient. Patient tolerated procedure. patient selected device color and closure method.  ? ?PLAN ?Patient to return in four to six weeks  for fitting and delivery of device. Plan of care was explained to and agreed upon by patient. All questions were answered and concerns addressed. ? ? ? ? ? ?

## 2021-12-16 DIAGNOSIS — M170 Bilateral primary osteoarthritis of knee: Secondary | ICD-10-CM | POA: Diagnosis not present

## 2021-12-25 DIAGNOSIS — M170 Bilateral primary osteoarthritis of knee: Secondary | ICD-10-CM | POA: Diagnosis not present

## 2021-12-30 DIAGNOSIS — Z6825 Body mass index (BMI) 25.0-25.9, adult: Secondary | ICD-10-CM | POA: Diagnosis not present

## 2021-12-30 DIAGNOSIS — G14 Postpolio syndrome: Secondary | ICD-10-CM | POA: Diagnosis not present

## 2021-12-30 DIAGNOSIS — G629 Polyneuropathy, unspecified: Secondary | ICD-10-CM | POA: Diagnosis not present

## 2021-12-31 DIAGNOSIS — T490X5A Adverse effect of local antifungal, anti-infective and anti-inflammatory drugs, initial encounter: Secondary | ICD-10-CM | POA: Diagnosis not present

## 2021-12-31 DIAGNOSIS — L570 Actinic keratosis: Secondary | ICD-10-CM | POA: Diagnosis not present

## 2021-12-31 DIAGNOSIS — L309 Dermatitis, unspecified: Secondary | ICD-10-CM | POA: Diagnosis not present

## 2022-01-01 DIAGNOSIS — M170 Bilateral primary osteoarthritis of knee: Secondary | ICD-10-CM | POA: Diagnosis not present

## 2022-01-12 DIAGNOSIS — N200 Calculus of kidney: Secondary | ICD-10-CM | POA: Insufficient documentation

## 2022-01-12 HISTORY — DX: Calculus of kidney: N20.0

## 2022-01-14 ENCOUNTER — Ambulatory Visit (INDEPENDENT_AMBULATORY_CARE_PROVIDER_SITE_OTHER): Payer: Medicare HMO

## 2022-01-14 DIAGNOSIS — M25372 Other instability, left ankle: Secondary | ICD-10-CM | POA: Diagnosis not present

## 2022-01-14 DIAGNOSIS — R269 Unspecified abnormalities of gait and mobility: Secondary | ICD-10-CM | POA: Diagnosis not present

## 2022-01-14 NOTE — Progress Notes (Signed)
SITUATION ?Reason for Visit: Dispensation and Fitting of Custom Molded Gauntlet ?Patient Report: Patient reports comfort in ambulation and understands all instructions. ? ?OBJECTIVE DATA ?Patient History / Diagnosis:   ?  ICD-10-CM   ?1. Ankle instability, left  M25.372   ?  ?2. Abnormality of gait  R26.9   ?  ? ? ?Provided Device:  [employer] Gauntlet: [employer] ? ?Goals of Orthosis: ?- Improve gait ?- Decrease energy expenditure during the gait cycle ?- Improve balance ?- Stabilize motion at ankle and subtalar joint ?- Compensate for muscle weakness ?- Facilitate motion ?- Provide triplanar ankle and foot stabilization for weight bearing activities ? ?Device Justification: ?- Patient is ambulatory  ?- Device is medically necessary as part of the overall treatment due to the patient's condition and related symptoms ?- It is anticipated that the patient will benefit functionally with use of the device.  ?- The custom device is utilized in an attempt to avoid the need for surgery and because a prefabricated device is inappropriate. ? ?Upon gait analysis, the device appeared to be fitting well and the patient states that the device is comfortable. ? ?ACTIONS PERFORMED ?Patient was fit with Dance movement psychotherapist. Patient tolerated fitting procedure. Fit of the device is good. Patient was able to apply properly and ambulate without distress. Device function is to restrict and limit motion and provide stabilization in the ankle joint.  ? ?Goals and function of this device were explained in detail to the patient. The patient was shown how to properly apply, wear, and care for the device. It was explained that the device will fit and function best in an adjustable-closure shoe with a firm heel counter and a wide base of support. When the device was dispensed, it was suitable for the patient's condition and not substandard. No guarantees were given. Precautions were reviewed.  ? ?Written  instructions, warranty information, and a copy of DMEPOS Supplier Standards were provided. All questions answered and concerns addressed. ? ?PLAN ?Patient is to follow up in one week or as necessary (PRN). Plan of care was discussed with and agreed upon by patient. ? ? ?

## 2022-02-13 DIAGNOSIS — M170 Bilateral primary osteoarthritis of knee: Secondary | ICD-10-CM | POA: Diagnosis not present

## 2022-02-14 DIAGNOSIS — Z79899 Other long term (current) drug therapy: Secondary | ICD-10-CM | POA: Diagnosis not present

## 2022-02-14 DIAGNOSIS — N1330 Unspecified hydronephrosis: Secondary | ICD-10-CM | POA: Diagnosis not present

## 2022-02-14 DIAGNOSIS — N289 Disorder of kidney and ureter, unspecified: Secondary | ICD-10-CM | POA: Diagnosis not present

## 2022-02-14 DIAGNOSIS — N23 Unspecified renal colic: Secondary | ICD-10-CM | POA: Diagnosis not present

## 2022-02-14 DIAGNOSIS — K439 Ventral hernia without obstruction or gangrene: Secondary | ICD-10-CM | POA: Diagnosis not present

## 2022-02-14 DIAGNOSIS — N200 Calculus of kidney: Secondary | ICD-10-CM | POA: Diagnosis not present

## 2022-02-14 DIAGNOSIS — R1033 Periumbilical pain: Secondary | ICD-10-CM | POA: Diagnosis not present

## 2022-02-14 DIAGNOSIS — N201 Calculus of ureter: Secondary | ICD-10-CM | POA: Diagnosis not present

## 2022-02-14 DIAGNOSIS — I4891 Unspecified atrial fibrillation: Secondary | ICD-10-CM | POA: Diagnosis not present

## 2022-02-14 DIAGNOSIS — N202 Calculus of kidney with calculus of ureter: Secondary | ICD-10-CM | POA: Diagnosis not present

## 2022-02-14 DIAGNOSIS — Z7901 Long term (current) use of anticoagulants: Secondary | ICD-10-CM | POA: Diagnosis not present

## 2022-02-16 ENCOUNTER — Other Ambulatory Visit: Payer: Self-pay | Admitting: Cardiology

## 2022-02-16 DIAGNOSIS — N201 Calculus of ureter: Secondary | ICD-10-CM | POA: Diagnosis not present

## 2022-02-16 DIAGNOSIS — N401 Enlarged prostate with lower urinary tract symptoms: Secondary | ICD-10-CM | POA: Diagnosis not present

## 2022-02-16 NOTE — Telephone Encounter (Signed)
Rx refill sent to pharmacy. 

## 2022-02-17 DIAGNOSIS — N201 Calculus of ureter: Secondary | ICD-10-CM | POA: Diagnosis not present

## 2022-02-17 DIAGNOSIS — Z87442 Personal history of urinary calculi: Secondary | ICD-10-CM | POA: Diagnosis not present

## 2022-02-18 DIAGNOSIS — N200 Calculus of kidney: Secondary | ICD-10-CM | POA: Diagnosis not present

## 2022-02-18 DIAGNOSIS — N201 Calculus of ureter: Secondary | ICD-10-CM | POA: Diagnosis not present

## 2022-02-18 DIAGNOSIS — Z87442 Personal history of urinary calculi: Secondary | ICD-10-CM | POA: Diagnosis not present

## 2022-02-18 DIAGNOSIS — M47816 Spondylosis without myelopathy or radiculopathy, lumbar region: Secondary | ICD-10-CM | POA: Diagnosis not present

## 2022-02-23 ENCOUNTER — Ambulatory Visit: Payer: Medicare HMO | Admitting: Podiatry

## 2022-02-25 DIAGNOSIS — N401 Enlarged prostate with lower urinary tract symptoms: Secondary | ICD-10-CM | POA: Diagnosis not present

## 2022-02-25 DIAGNOSIS — N201 Calculus of ureter: Secondary | ICD-10-CM | POA: Diagnosis not present

## 2022-03-02 ENCOUNTER — Ambulatory Visit: Payer: Medicare HMO | Admitting: Podiatry

## 2022-03-02 ENCOUNTER — Encounter: Payer: Self-pay | Admitting: Podiatry

## 2022-03-02 DIAGNOSIS — M79674 Pain in right toe(s): Secondary | ICD-10-CM | POA: Diagnosis not present

## 2022-03-02 DIAGNOSIS — B351 Tinea unguium: Secondary | ICD-10-CM

## 2022-03-02 DIAGNOSIS — M79675 Pain in left toe(s): Secondary | ICD-10-CM

## 2022-03-02 DIAGNOSIS — I739 Peripheral vascular disease, unspecified: Secondary | ICD-10-CM | POA: Diagnosis not present

## 2022-03-02 NOTE — Progress Notes (Signed)
Subjective: Terry Villanueva is a 86 y.o. diabetic male patient seen today in office with complaint of mildly painful thickened and elongated toenails and callus; unable to trim.   Patient still on Eliquis as previously noted.  Denies any changes with medications or history since last visit.  No other issues noted.    Patient Active Problem List   Diagnosis Date Noted   Heart disease 07/17/2021   Sciatica    Polio    Esophageal dysphagia    Impingement syndrome of left shoulder region 09/20/2018   GERD (gastroesophageal reflux disease) 02/21/2018   Hypothyroidism 02/21/2018   Arthritis 02/21/2018   PAF (paroxysmal atrial fibrillation) ([place]) 06/07/2017   Hypertensive heart disease 06/07/2017   Essential hypertension 06/07/2017   Long term current use of anticoagulant 12/19/2016   Erectile dysfunction due to arterial insufficiency 07/24/2015   Prostate nodule 07/24/2015   Carpal tunnel syndrome of left wrist 08/28/2014   Cervical radiculopathy 08/16/2014    Current Outpatient Medications on File Prior to Visit  Medication Sig Dispense Refill   apixaban (ELIQUIS) 5 MG TABS tablet TAKE 1 TABLET BY MOUTH TWICE A DAY 180 tablet 0   Ascorbic Acid 500 MG CHEW Chew by mouth.     diltiazem (CARDIZEM) 30 MG tablet TAKE 1 TABLET (30 MG TOTAL) BY MOUTH EVERY 8 (EIGHT) HOURS AS NEEDED (FOR HEART RATE GREATER THAN 110 BPM). 90 tablet 5   levothyroxine (SYNTHROID) 50 MCG tablet levothyroxine 50 mcg tablet   1 tablet every day by oral route.     metoprolol succinate (TOPROL-XL) 25 MG 24 hr tablet TAKE 1 TABLET BY MOUTH EVERY DAY 90 tablet 0   Multiple Vitamins-Minerals (MACULAR HEALTH FORMULA PO) Take 1 tablet by mouth 2 (two) times daily.      omeprazole (PRILOSEC) 20 MG capsule TAKE 1 CAPSULE BY MOUTH EVERY DAY 90 capsule 3   triamcinolone cream (KENALOG) 0.1 % Apply topically 2 (two) times daily as needed.     vitamin C (ASCORBIC ACID) 500 MG tablet Take 1,000 mg by mouth daily.      Current  Facility-Administered Medications on File Prior to Visit  Medication Dose Route Frequency Provider Last Rate Last Admin   0.9 %  sodium chloride infusion  500 mL Intravenous Once Jackquline Denmark, MD        No Known Allergies  Objective: Physical Exam  General: Well developed, nourished, no acute distress, awake, alert and oriented x 3  Vascular: Dorsalis pedis artery 1/4 bilateral, Posterior tibial artery 0/4 bilateral due to trace edema today bilateral, skin temperature warm to warm proximal to distal bilateral lower extremities, moderate varicosities, decreased pedal hair present bilateral.  Neurological: Gross sensation present via light touch bilateral.   Dermatological: Skin is warm, dry, and supple bilateral, Nails 1-10 are tender, long, thick, and discolored with mild subungal debris, no webspace macerations present bilateral, no open lesions present bilateral, callus submet 5 present bilateral worse on the left. No signs of infection bilateral.  Musculoskeletal: There is laxity noted at the left lateral ankle with cavovarus foot type and foot deformity with history of polio.  Asymptomatic hammertoe boney deformities noted bilateral. Muscular strength within normal limits without painon range of motion however there is concerning instability/abnormality in gait patient feels like his left foot is supinating on his orthotic. No pain with calf compression bilateral.  Assessment and Plan:  Problem List Items Addressed This Visit   None Visit Diagnoses     Pain due to onychomycosis of toenails  of both feet    -  Primary   PAD (peripheral artery disease) ([place])           -Examined patient.  -Discussed with patient foot deformity with instability in supination on the left. Awaiting brace -Re-Discussed treatment options for painful mycotic nails. -Mechanically debrided and reduced all painful mycotic nails with sterile nail nipper and dremel nail file without incident. -Mechanically  smoothed callus x2 using rotary Dremel without incident at no additional charge  -Continue with good supportive shoes daily for foot type until he can be evaluated by Aaron Edelman for brace on the left -Encouraged daily skin emollients as previously recommended -Patient to return in 3 month  Lorenda Peck, DPM

## 2022-03-27 ENCOUNTER — Other Ambulatory Visit: Payer: Self-pay | Admitting: Cardiology

## 2022-03-27 DIAGNOSIS — I480 Paroxysmal atrial fibrillation: Secondary | ICD-10-CM

## 2022-03-27 NOTE — Telephone Encounter (Signed)
Prescription refill request for Eliquis received. Indication:Afib  Last office visit:03/10/21 [facility])  Scr: 0.7 (10/04/21 via [place]) Age: 86 Weight: 87.5kg  Pt has schedule appt with Dr Bettina Gavia on 04/08/22. Appropriate dose and refill sent to requested pharmacy.

## 2022-04-07 NOTE — Progress Notes (Unsigned)
Cardiology Office Note:    Date:  04/08/2022   ID:  Terry Villanueva, DOB 1935-10-27, MRN [id number]  PCP:  Terry Sheriff, MD  Cardiologist:  Terry More, MD    Referring MD: Terry Sheriff, MD    ASSESSMENT:    1. PAF (paroxysmal atrial fibrillation) ([place])   2. Long term current use of anticoagulant   3. Hypertensive heart disease without heart failure    PLAN:    In order of problems listed above:  He continues to do well with no clinical recurrence of atrial fibrillation and will continue his low-dose beta-blocker without significant bradycardia and his current anticoagulant without bleeding complication  Blood pressure controlled.  Continue his current treatment and Home self-management   Next appointment: 9 months   Medication Adjustments/Labs and Tests Ordered: Current medicines are reviewed at length with the patient today.  Concerns regarding medicines are outlined above.  No orders of the defined types were placed in this encounter.  No orders of the defined types were placed in this encounter.   Chief Complaint  Patient presents with   Annual Exam   Follow-up   Atrial Fibrillation    History of Present Illness:    Terry Villanueva is a 86 y.o. male with a hx of paroxysmal atrial fibrillation asthma and edema due to chronic venous insufficiency last seen 03/10/2021 maintaining sinus rhythm with home surveillance using the mobile Kardia device. Compliance with diet, lifestyle and medications: Yes  Mr. Terry Villanueva continues to do well no recurrent atrial fibrillation and self monitors with his apple smart watch He has had no alerts for bradycardia and tolerates his anticoagulant without any bleeding. Past Medical History:  Diagnosis Date   Arthritis 02/21/2018   Carpal tunnel syndrome of left wrist 08/28/2014   Cervical radiculopathy 08/16/2014   Erectile dysfunction due to arterial insufficiency 07/24/2015   Esophageal dysphagia    Essential hypertension  06/07/2017   GERD (gastroesophageal reflux disease) 02/21/2018   Hypothyroidism 02/21/2018   Kidney stone 01/2022   Long term current use of anticoagulant 12/19/2016   PAF (paroxysmal atrial fibrillation) ([place]) 06/07/2017   Polio    1952   Prostate nodule 07/24/2015   Sciatica     Past Surgical History:  Procedure Laterality Date   BACK SURGERY     COLONOSCOPY  12/08/2010   Mild sigmoid diverticulosis. Small internal hemorrhoids   ESOPHAGOGASTRODUODENOSCOPY  07/08/2015   Schatzki ring status post esophageal dilatation. Small hiatal hernia   FLEXIBLE SIGMOIDOSCOPY  1999   Dr [place]     WRIST SURGERY Left 07/2016   Carpal Tunnel    Current Medications: Current Meds  Medication Sig   benzonatate (TESSALON) 200 MG capsule Take 200 mg by mouth 3 (three) times daily as needed for cough.   diltiazem (CARDIZEM) 30 MG tablet TAKE 1 TABLET (30 MG TOTAL) BY MOUTH EVERY 8 (EIGHT) HOURS AS NEEDED (FOR HEART RATE GREATER THAN 110 BPM).   doxycycline (VIBRA-TABS) 100 MG tablet Take 100 mg by mouth 2 (two) times daily.   ELIQUIS 5 MG TABS tablet TAKE 1 TABLET BY MOUTH TWICE A DAY   levothyroxine (SYNTHROID) 50 MCG tablet levothyroxine 50 mcg tablet   1 tablet every day by oral route.   metoprolol succinate (TOPROL-XL) 25 MG 24 hr tablet TAKE 1 TABLET BY MOUTH EVERY DAY   Multiple Vitamins-Minerals (MACULAR HEALTH FORMULA PO) Take 1 tablet by  mouth 2 (two) times daily.    omeprazole (PRILOSEC) 20 MG capsule TAKE 1 CAPSULE BY MOUTH EVERY DAY   vitamin C (ASCORBIC ACID) 500 MG tablet Take 1,000 mg by mouth daily.   Current Facility-Administered Medications for the 04/08/22 encounter (Office Visit) with Richardo Priest, MD  Medication   0.9 %  sodium chloride infusion     Allergies:   Patient has no known allergies.   Social History   Socioeconomic History   Marital status: Married    Spouse name: Not on file   Number of children: Not  on file   Years of education: Not on file   Highest education level: Not on file  Occupational History   Not on file  Tobacco Use   Smoking status: Never   Smokeless tobacco: Never  Vaping Use   Vaping Use: Never used  Substance and Sexual Activity   Alcohol use: Not Currently   Drug use: Not Currently   Sexual activity: Not on file  Other Topics Concern   Not on file  Social History Narrative   Not on file   Social Determinants of Health   Financial Resource Strain: Not on file  Food Insecurity: Not on file  Transportation Needs: Not on file  Physical Activity: Not on file  Stress: Not on file  Social Connections: Not on file     Family History: The patient's family history includes Cancer in his brother and mother; Diabetes in his mother; Heart disease in his brother, father, and mother; Hypertension in his brother; Stroke in his mother. There is no history of Colon cancer, Rectal cancer, or Esophageal cancer. ROS:   Please see the history of present illness.    All other systems reviewed and are negative.  EKGs/Labs/Other Studies Reviewed:    The following studies were reviewed today:  EKG:  EKG ordered today and personally reviewed.  The ekg ordered today demonstrates sinus bradycardia 55 bpm left anterior hemiblock otherwise normal  Recent Labs: 07/17/2022: Cholesterol 165 triglycerides 126 A1c 5.6 hemoglobin 13.7 creatinine 0.7 potassium 4.2  Physical Exam:    VS:  BP 114/70 (BP Location: Left Arm, Patient Position: Sitting, Cuff Size: Normal)   Pulse (!) 55   Ht 6' (1.829 m)   Wt 189 lb (85.7 kg)   SpO2 95%   BMI 25.63 kg/m     Wt Readings from Last 3 Encounters:  04/08/22 189 lb (85.7 kg)  07/21/21 193 lb (87.5 kg)  03/10/21 183 lb (83 kg)     GEN: He appears his age well nourished, well developed in no acute distress HEENT: Normal NECK: No JVD; No carotid bruits LYMPHATICS: No lymphadenopathy CARDIAC: RRR, no murmurs, rubs,  gallops RESPIRATORY:  Clear to auscultation without rales, wheezing or rhonchi  ABDOMEN: Soft, non-tender, non-distended MUSCULOSKELETAL:  No edema; No deformity  SKIN: Warm and dry NEUROLOGIC:  Alert and oriented x 3 PSYCHIATRIC:  Normal affect    Signed, Terry More, MD  04/08/2022 9:07 AM    Terry Villanueva

## 2022-04-08 ENCOUNTER — Ambulatory Visit: Payer: Medicare HMO | Admitting: Cardiology

## 2022-04-08 ENCOUNTER — Encounter: Payer: Self-pay | Admitting: *Deleted

## 2022-04-08 VITALS — BP 114/70 | HR 55 | Ht 72.0 in | Wt 189.0 lb

## 2022-04-08 DIAGNOSIS — Z7901 Long term (current) use of anticoagulants: Secondary | ICD-10-CM | POA: Diagnosis not present

## 2022-04-08 DIAGNOSIS — I480 Paroxysmal atrial fibrillation: Secondary | ICD-10-CM | POA: Diagnosis not present

## 2022-04-08 DIAGNOSIS — I119 Hypertensive heart disease without heart failure: Secondary | ICD-10-CM | POA: Diagnosis not present

## 2022-04-08 NOTE — Patient Instructions (Signed)

## 2022-04-14 DIAGNOSIS — H353131 Nonexudative age-related macular degeneration, bilateral, early dry stage: Secondary | ICD-10-CM | POA: Diagnosis not present

## 2022-04-20 DIAGNOSIS — M8580 Other specified disorders of bone density and structure, unspecified site: Secondary | ICD-10-CM | POA: Diagnosis not present

## 2022-04-20 DIAGNOSIS — E039 Hypothyroidism, unspecified: Secondary | ICD-10-CM | POA: Diagnosis not present

## 2022-04-20 DIAGNOSIS — M899 Disorder of bone, unspecified: Secondary | ICD-10-CM | POA: Diagnosis not present

## 2022-04-23 IMAGING — CT CT CERVICAL SPINE W/O CM
2 series · 13 of 29 positions shown, 16 images · non-contrast
Comparison: None.

CLINICAL DATA: Status post fall.

EXAM:
CT CERVICAL SPINE WITHOUT CONTRAST
TECHNIQUE: Multidetector CT imaging of the cervical spine was performed without
intravenous contrast. Multiplanar CT image reconstructions were also
generated.

[Series 3: c spine soft · axial · 0.41mm/px · z∈[-200,-74]mm · 8 of 75 slices shown, 10 images]
[im 6/75  soft-tissue]
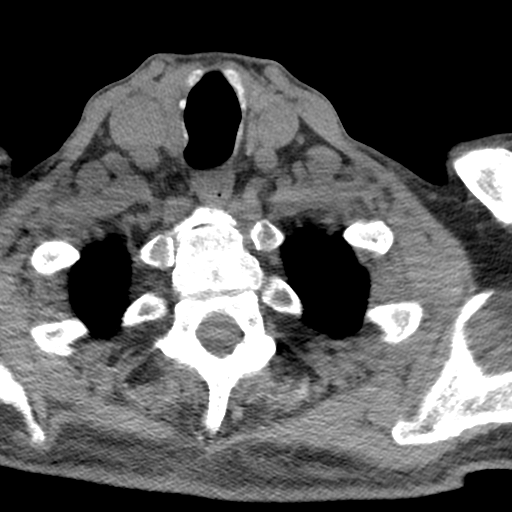
[im 6/75  bone]
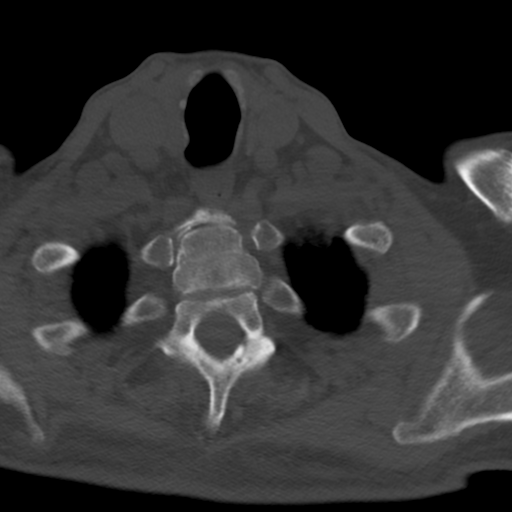
[im 18/75  bone]
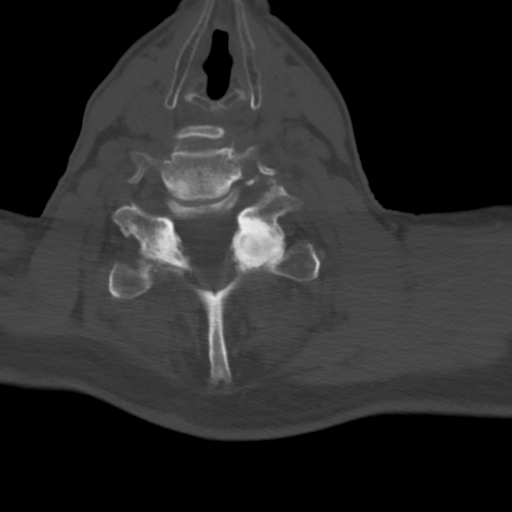
[im 23/75  bone]
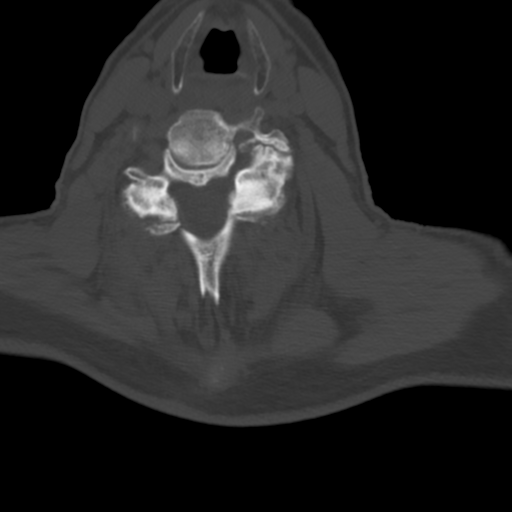
[im 35/75  bone]
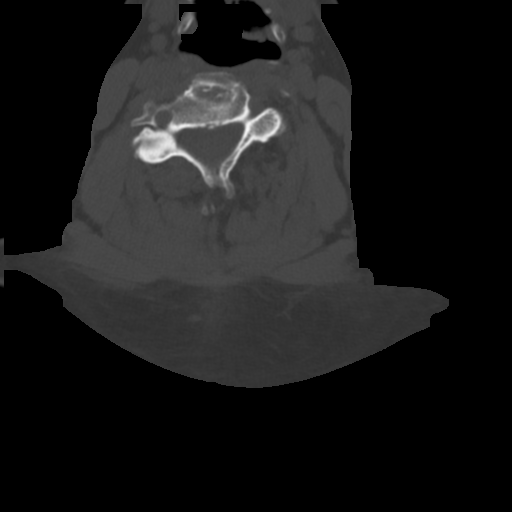
[im 40/75  soft-tissue]
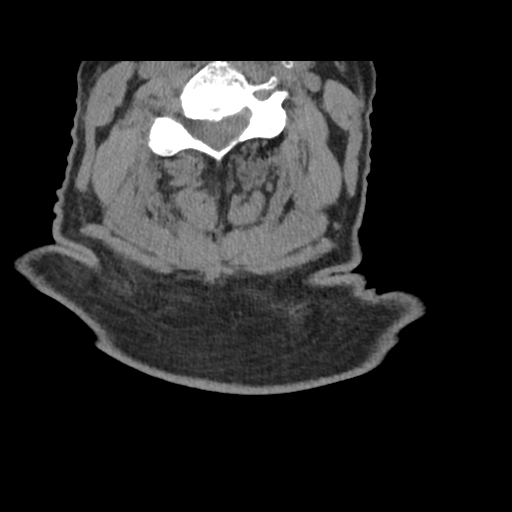
[im 40/75  bone]
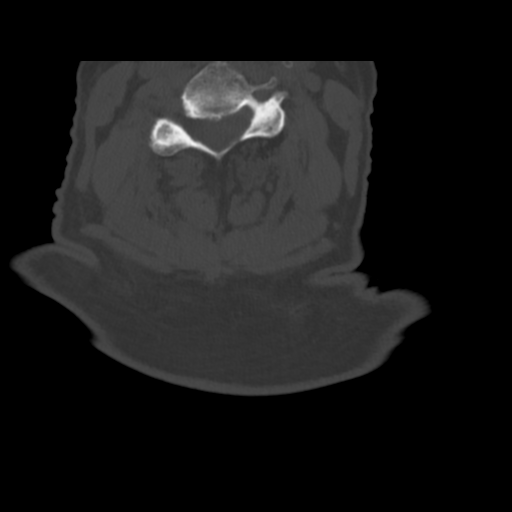
[im 52/75  bone]
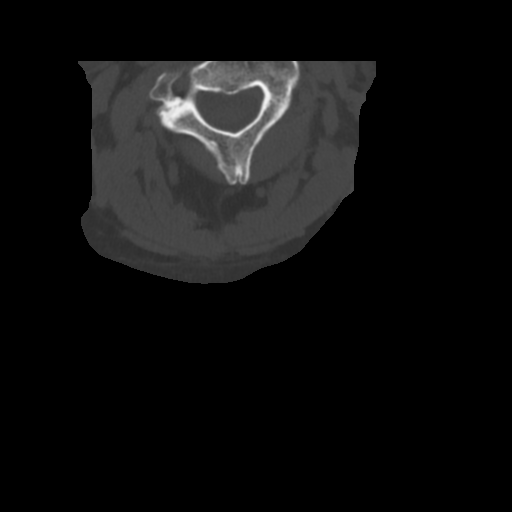
[im 57/75  bone]
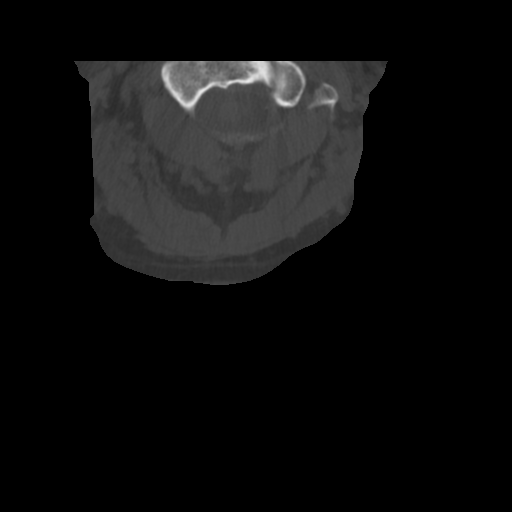
[im 69/75  bone]
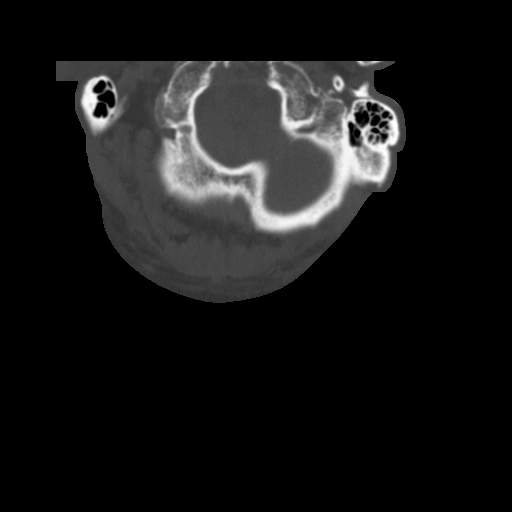

[Series 4: sagittal bone · sagittal · 0.30mm/px · 5 of 71 slices shown, 6 images]
[im 24/71  bone]
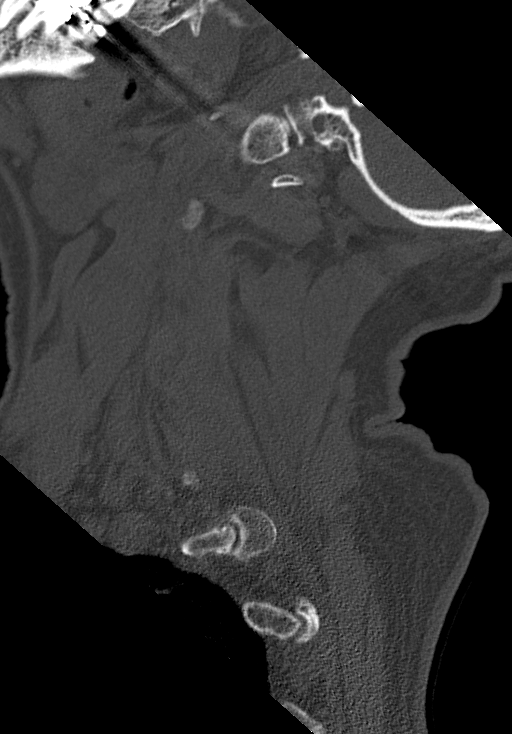
[im 30/71  bone]
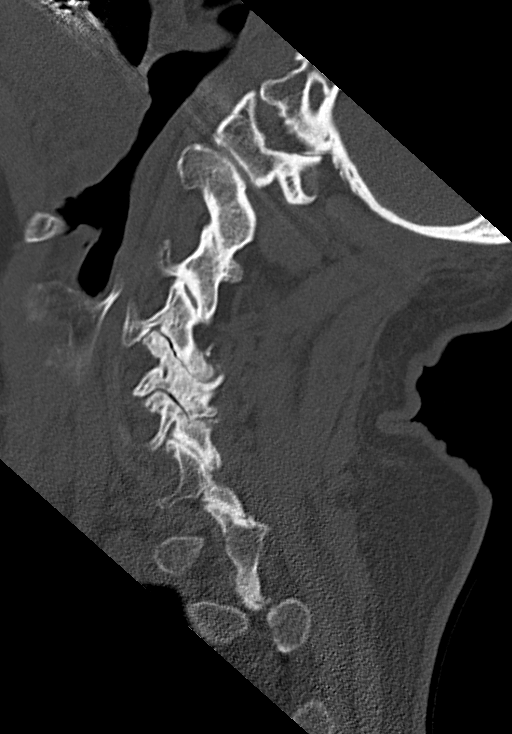
[im 36/71  soft-tissue]
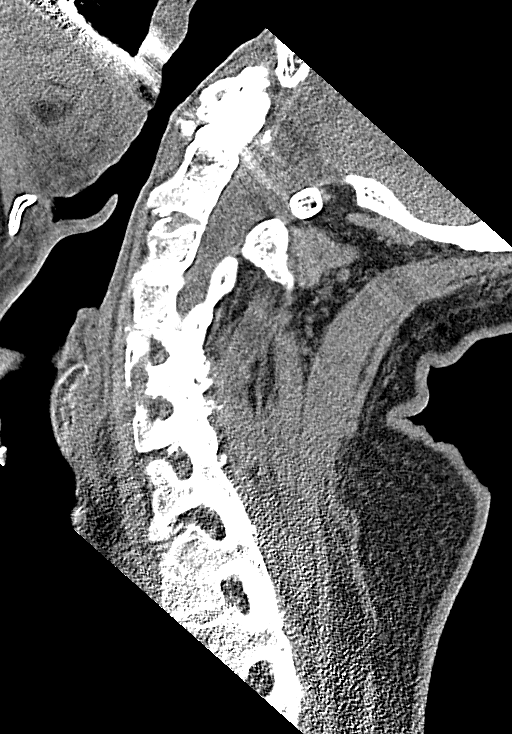
[im 36/71  bone]
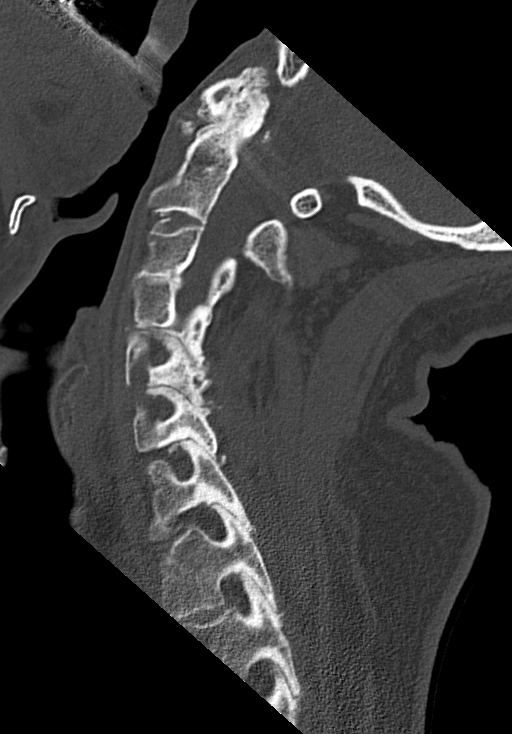
[im 41/71  bone]
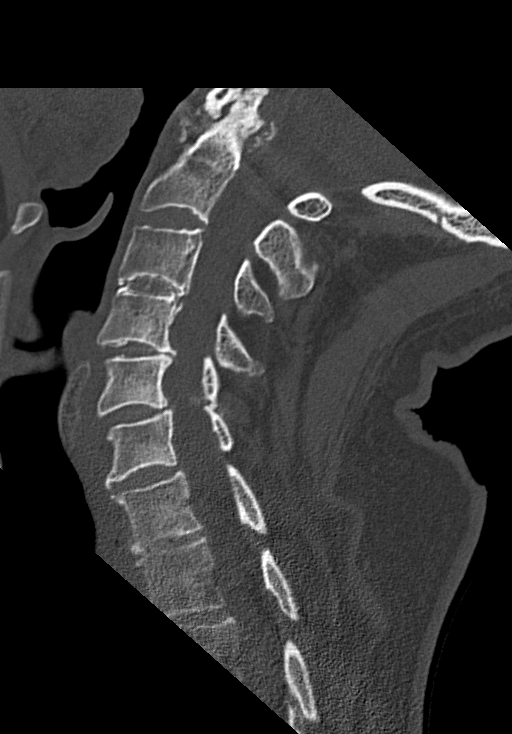
[im 47/71  bone]
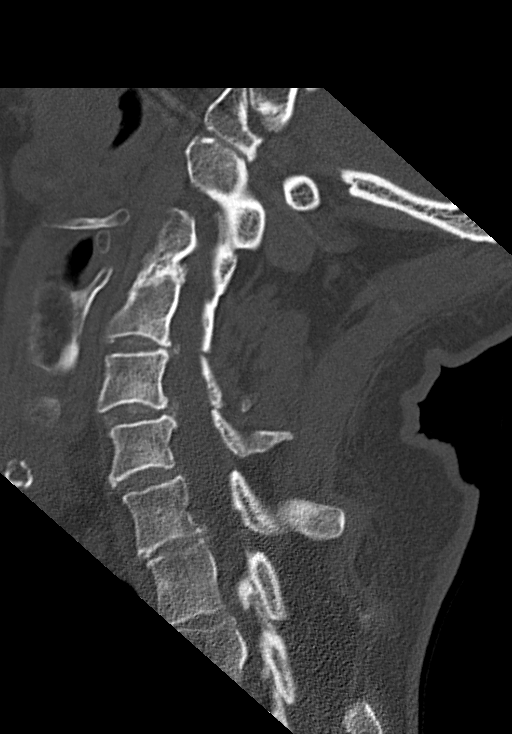

[13 of 29 positions shown; findings below may reference images not displayed]

FINDINGS: Alignment: Normal.

Skull base and vertebrae: No acute fracture. Moderate severity
chronic and degenerative changes are seen involving the body and tip
of the dens. No primary bone lesion or focal pathologic process.

Soft tissues and spinal canal: No prevertebral fluid or swelling. No
visible canal hematoma.

Disc levels: Moderate severity endplate sclerosis is seen at the
levels of C3-C4 and C4-C5 with mild anterior osteophyte formation
seen at the levels of C5-C6, C6-C7 and C7-T1.

Moderate severity multilevel intervertebral disc space narrowing is
seen. Marked severity intervertebral disc space narrowing is noted
at the level of C7-T1.

Marked severity bilateral multilevel facet joint hypertrophy is
noted.

Upper chest: Negative.

Other: None.
IMPRESSION: 1. No acute cervical spine fracture.
2. Marked severity multilevel degenerative changes.

## 2022-04-23 IMAGING — CT CT HEAD W/O CM
3 series · 15 of 47 positions shown, 18 images · non-contrast
Comparison: None.

CLINICAL DATA: Status post fall.

EXAM:
CT HEAD WITHOUT CONTRAST
TECHNIQUE: Contiguous axial images were obtained from the base of the skull
through the vertex without intravenous contrast.

[Series 2: head wo · axial · 0.45mm/px · z∈[-74,+62]mm · 9 of 33 slices shown, 12 images]
[im 3/33  brain]
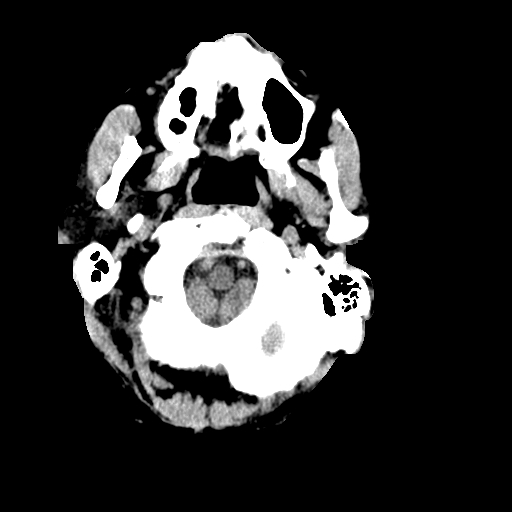
[im 3/33  bone]
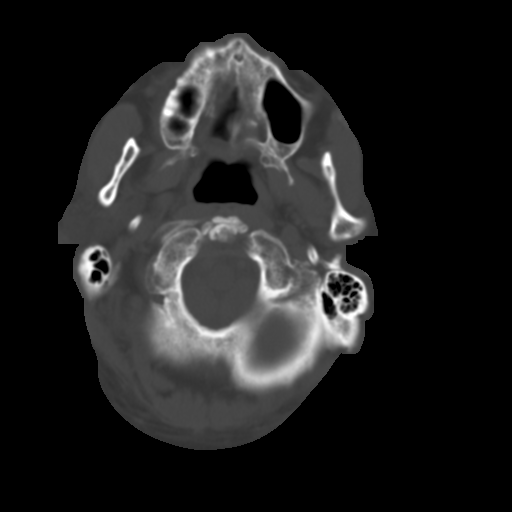
[im 6/33  brain]
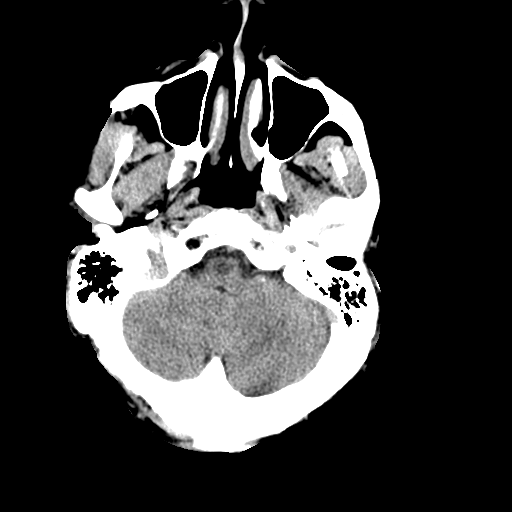
[im 9/33  brain]
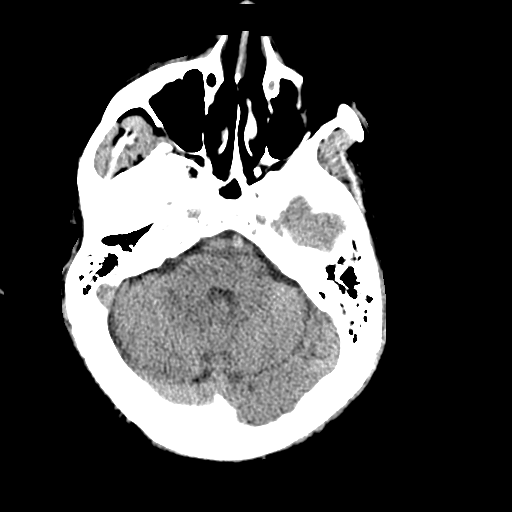
[im 13/33  brain]
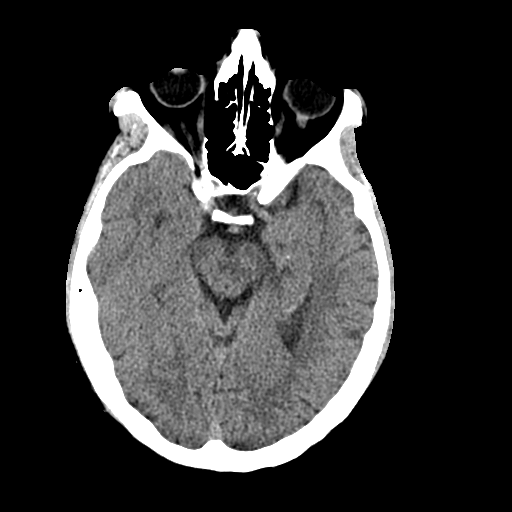
[im 17/33  brain]
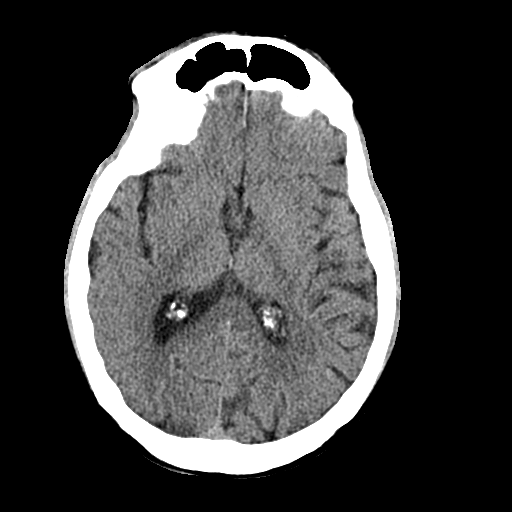
[im 17/33  bone]
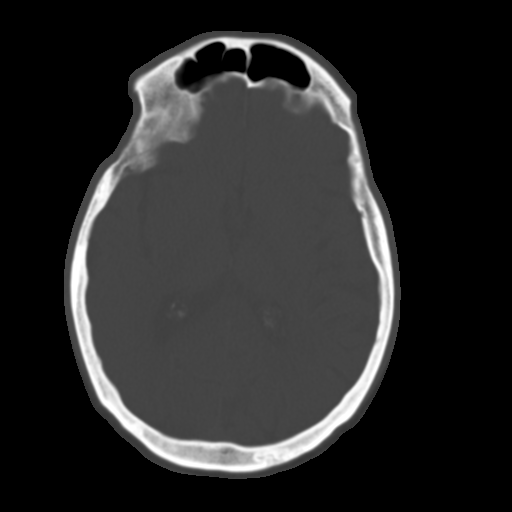
[im 20/33  brain]
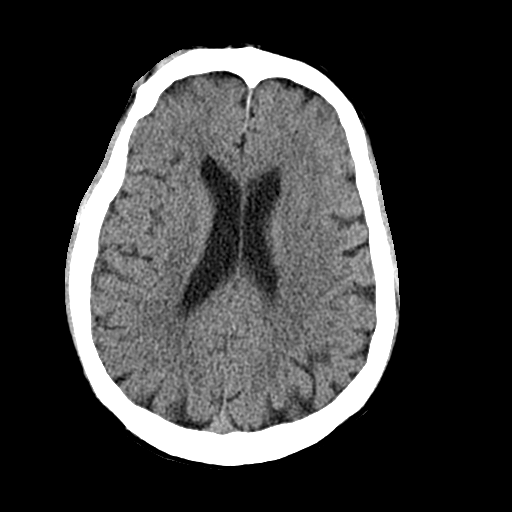
[im 24/33  brain]
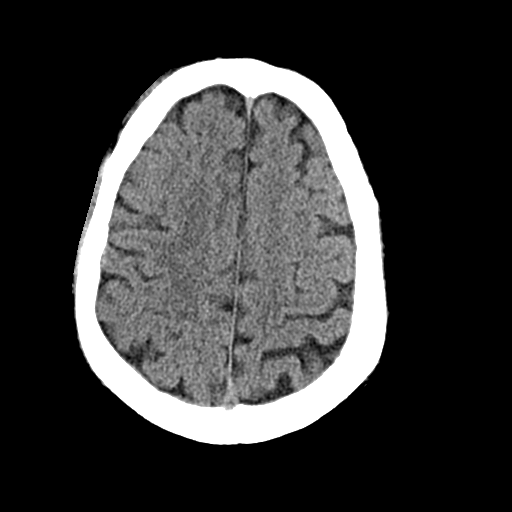
[im 27/33  brain]
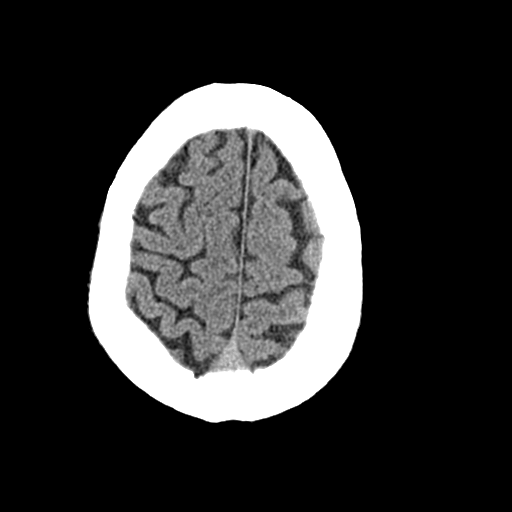
[im 30/33  brain]
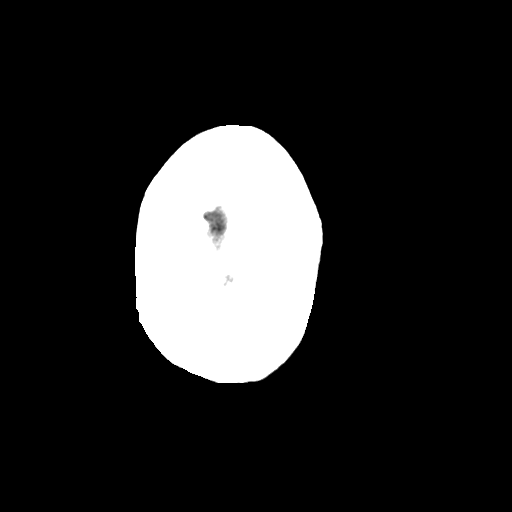
[im 30/33  bone]
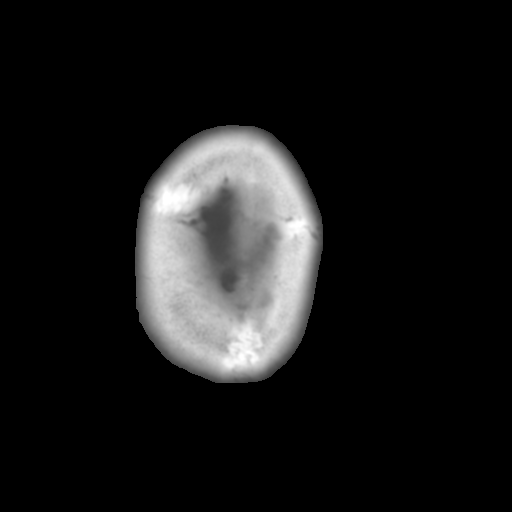

[Series 4: head coronal · coronal · 0.34mm/px · 3 of 84 slices shown]
[im 28/84  brain]
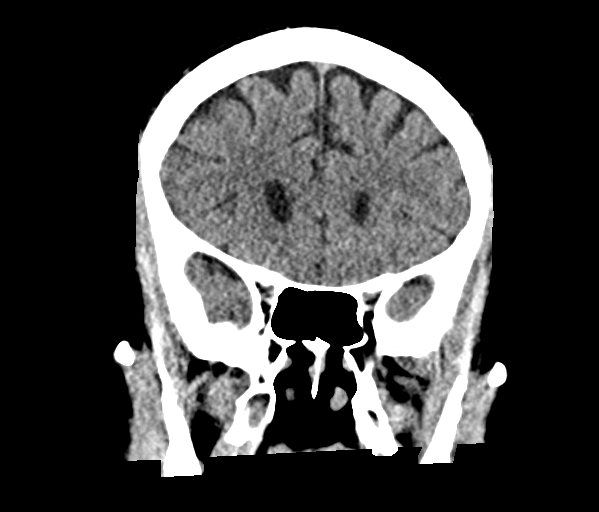
[im 37/84  brain]
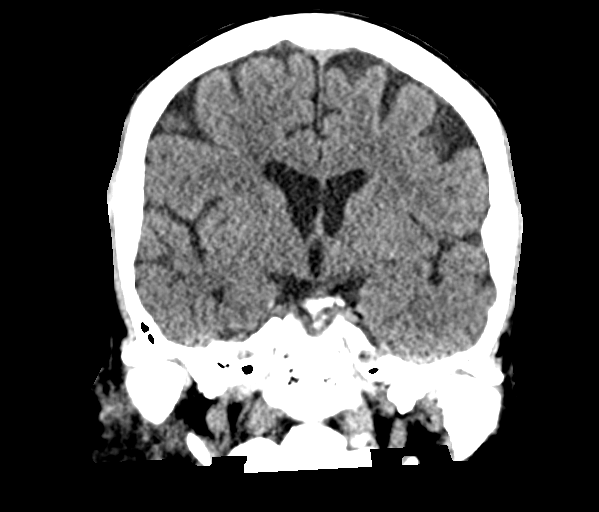
[im 47/84  brain]
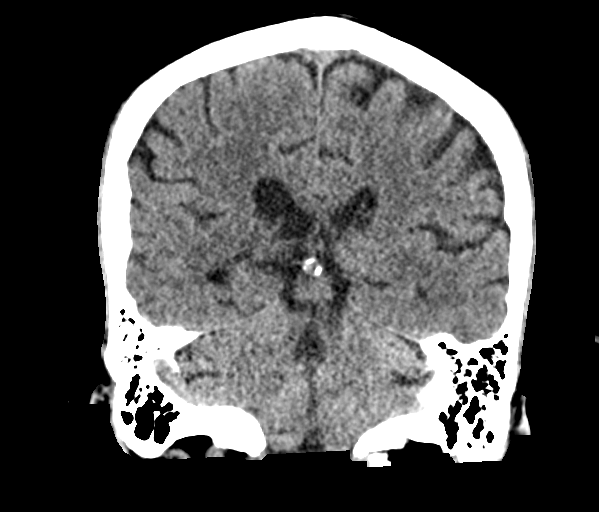

[Series 5: head sagittal · sagittal · 0.32mm/px · 3 of 64 slices shown]
[im 22/64  brain]
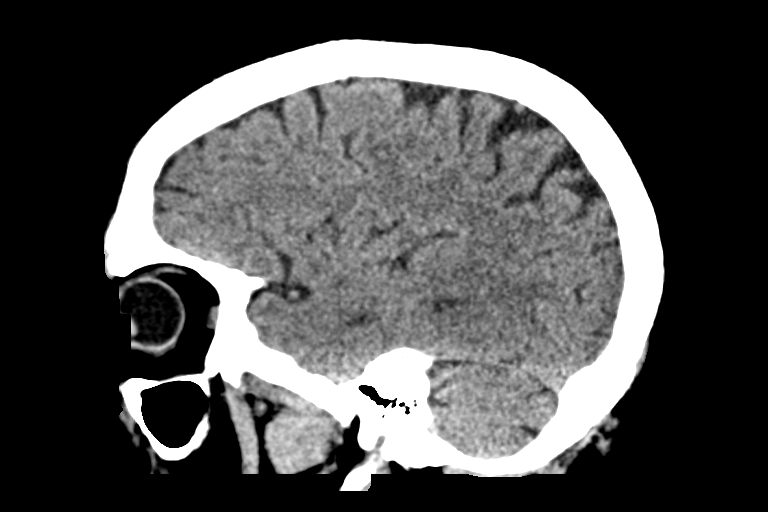
[im 32/64  brain]
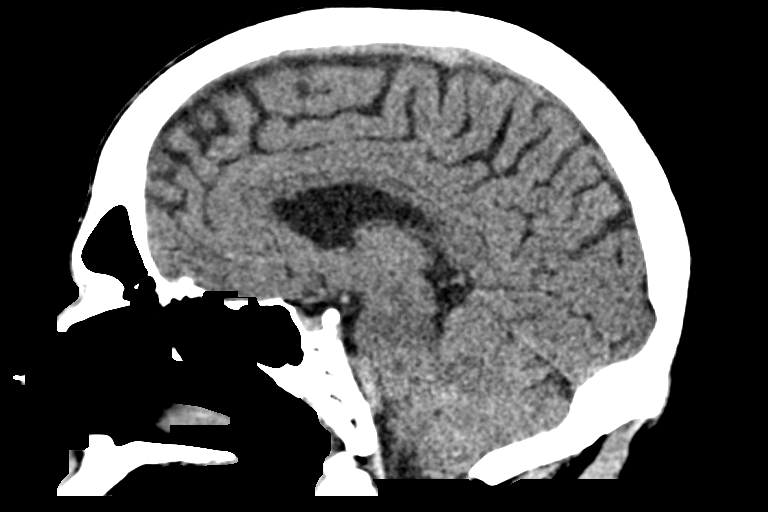
[im 43/64  brain]
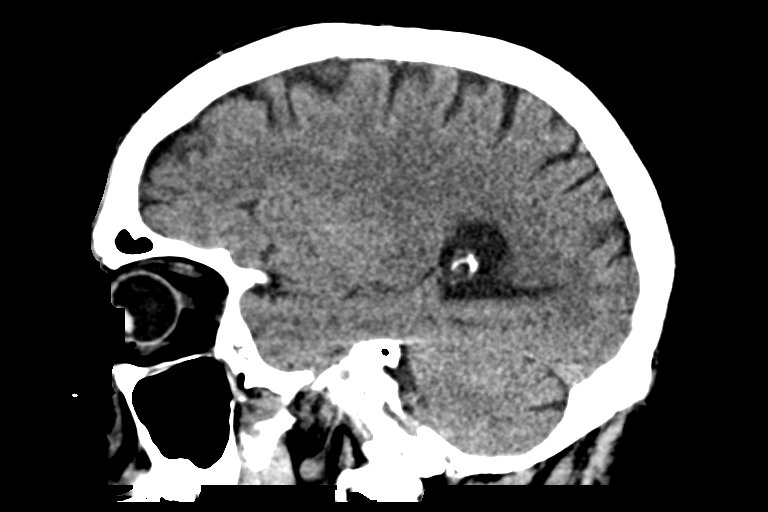

[15 of 47 positions shown; findings below may reference images not displayed]

FINDINGS: Brain: There is mild cerebral atrophy with widening of the
extra-axial spaces and ventricular dilatation.
There are areas of decreased attenuation within the white matter
tracts of the supratentorial brain, consistent with microvascular
disease changes.

Vascular: No hyperdense vessel or unexpected calcification.

Skull: Normal. Negative for fracture or focal lesion.

Sinuses/Orbits: No acute finding.

Other: There is moderate severity right frontal scalp soft tissue
swelling. An associated scalp hematoma is noted.
IMPRESSION: 1. Moderate severity right frontal scalp soft tissue swelling and
associated scalp hematoma.
2. No acute intracranial abnormality.

## 2022-04-23 IMAGING — CT CT MAXILLOFACIAL W/O CM
3 of 4 series · 16 of 47 positions shown, 19 images · non-contrast
Comparison: None.

CLINICAL DATA: Status post fall.

EXAM:
CT MAXILLOFACIAL WITHOUT CONTRAST
TECHNIQUE: Multidetector CT imaging of the maxillofacial structures was
performed. Multiplanar CT image reconstructions were also generated.

[Series 2: max soft · axial · 0.36mm/px · z∈[-142,-2]mm · 11 of 82 slices shown, 14 images]
[im 6/82  brain]
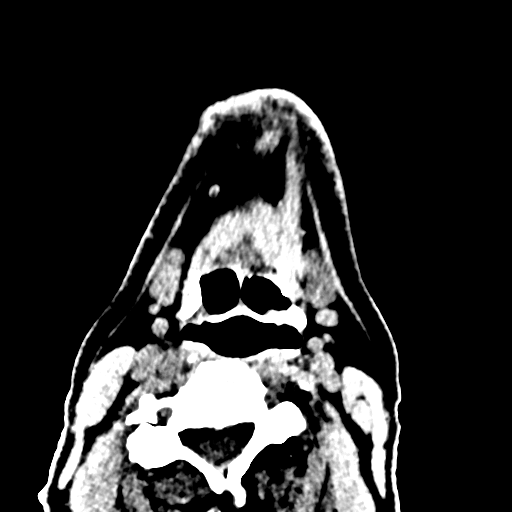
[im 6/82  bone]
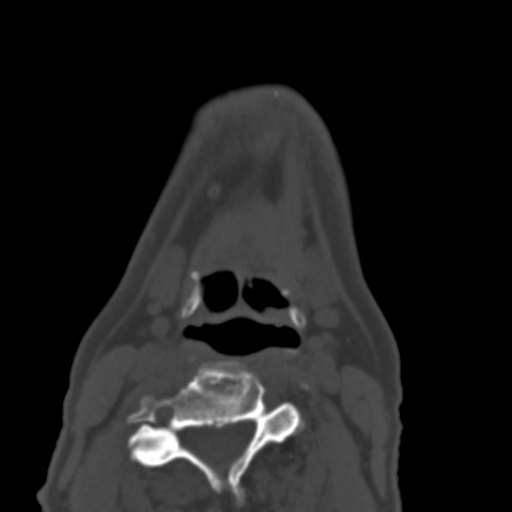
[im 12/82  bone]
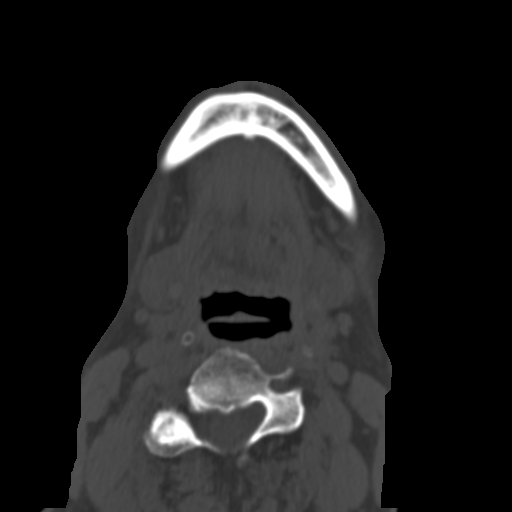
[im 20/82  bone]
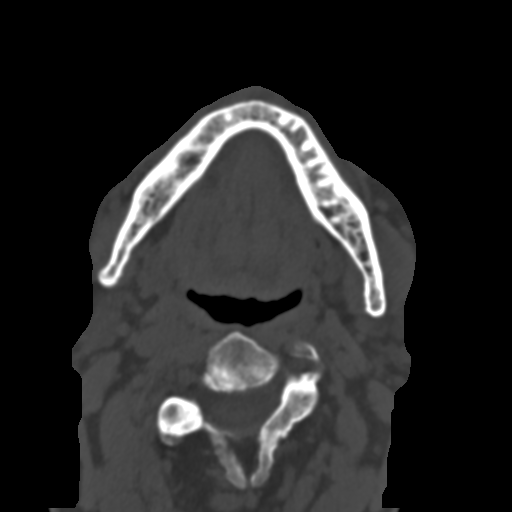
[im 26/82  bone]
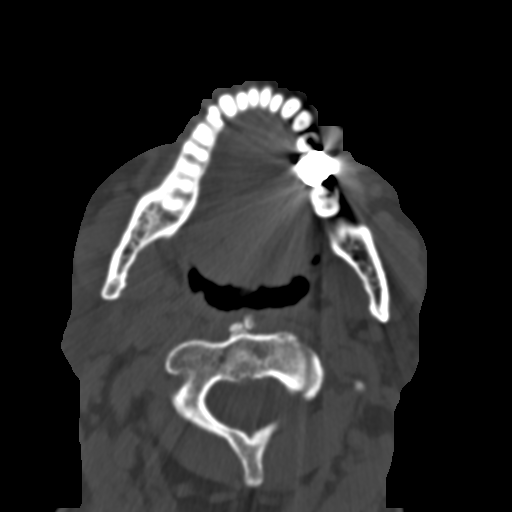
[im 34/82  brain]
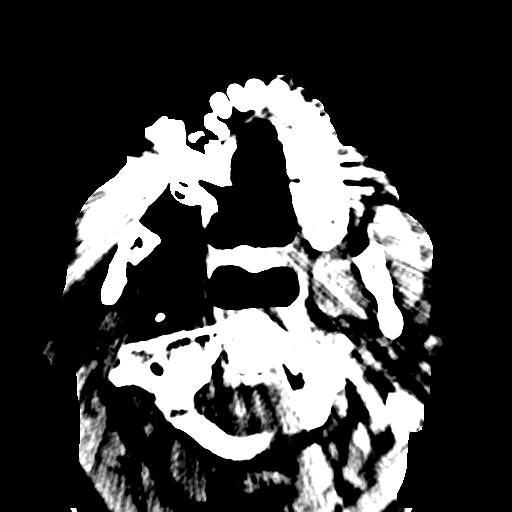
[im 34/82  bone]
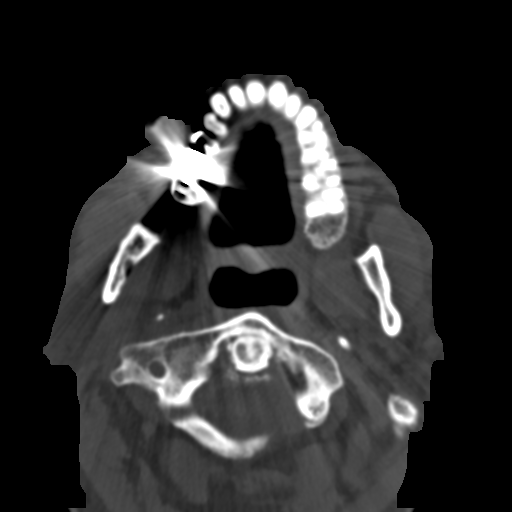
[im 42/82  bone]
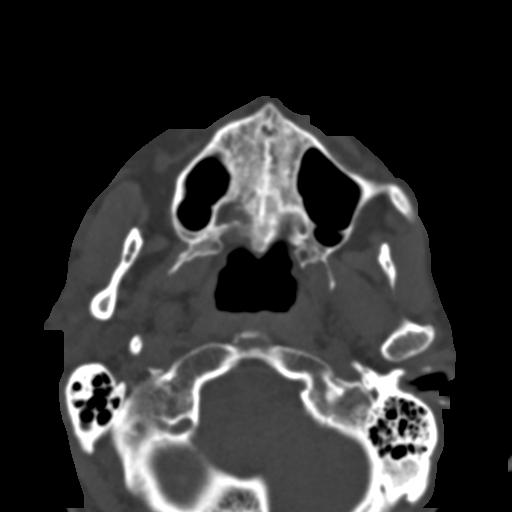
[im 48/82  bone]
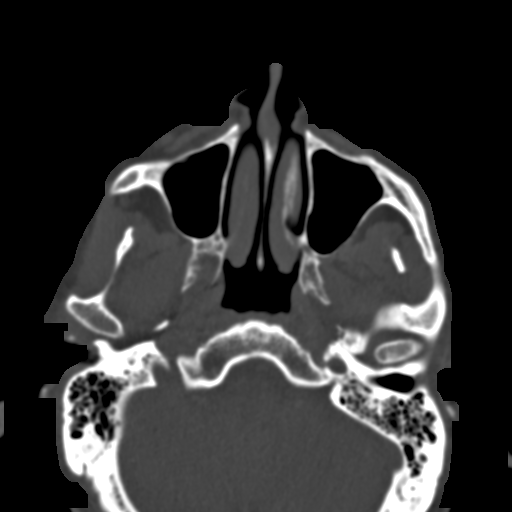
[im 56/82  bone]
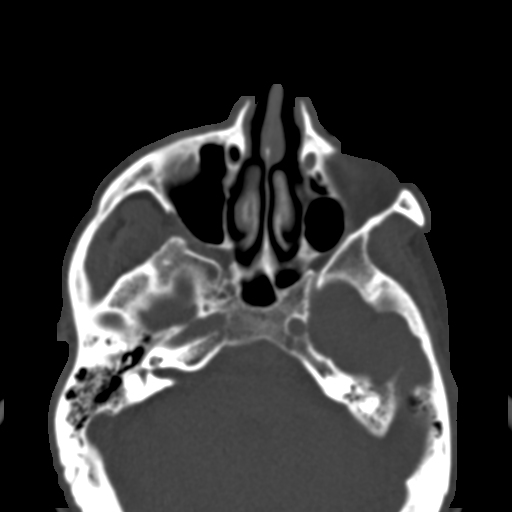
[im 62/82  brain]
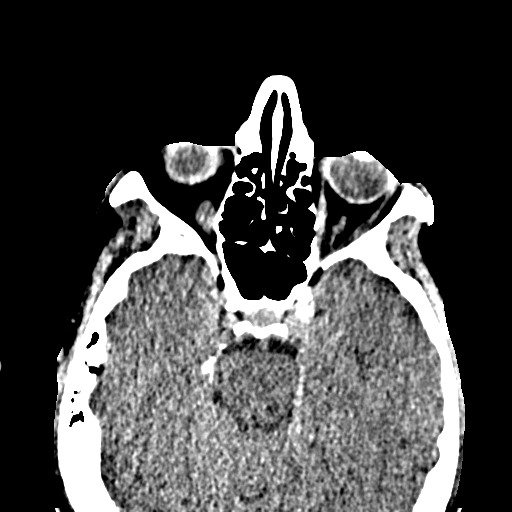
[im 62/82  bone]
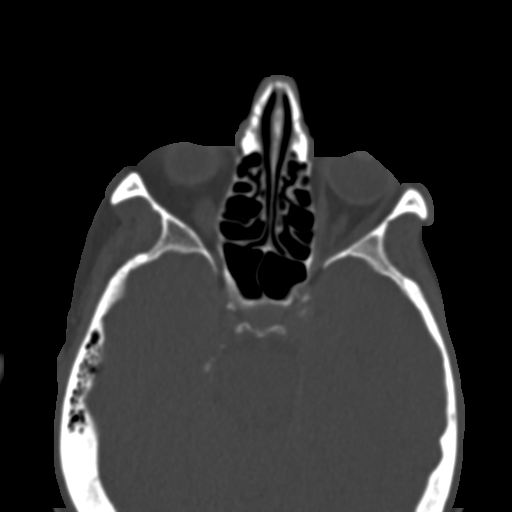
[im 70/82  bone]
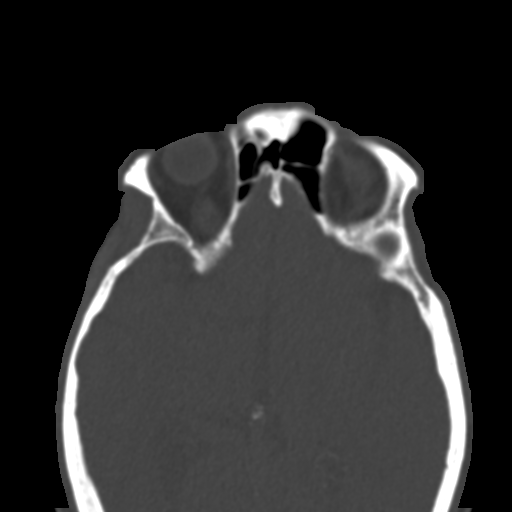
[im 76/82  bone]
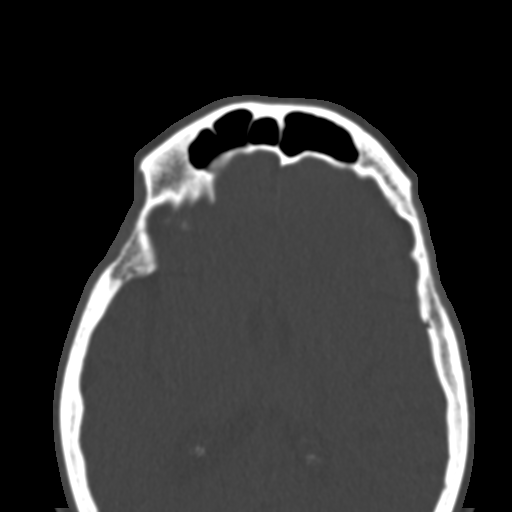

[Series 4: coronal soft · coronal · 0.38mm/px · 3 of 86 slices shown]
[im 29/86  bone]
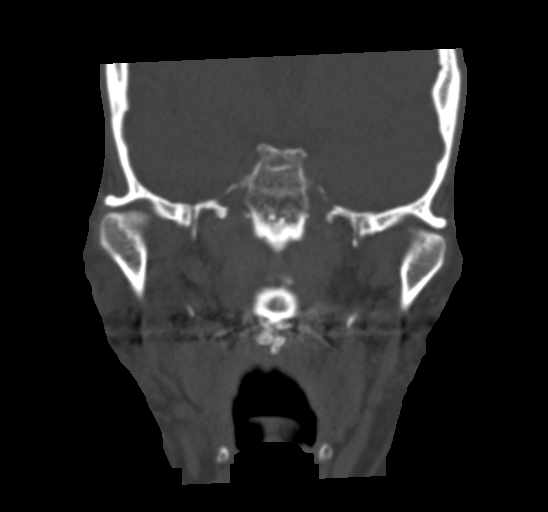
[im 38/86  bone]
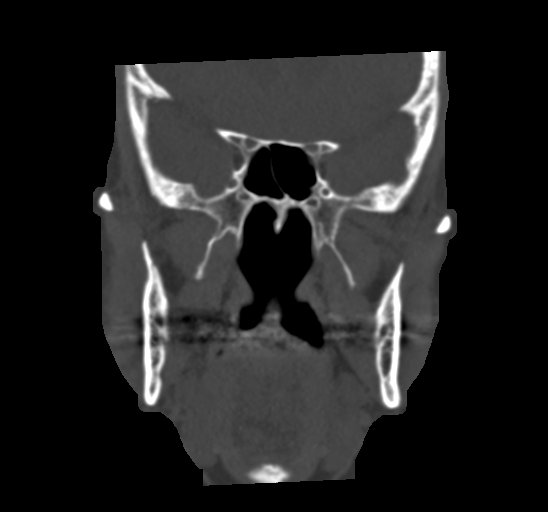
[im 48/86  bone]
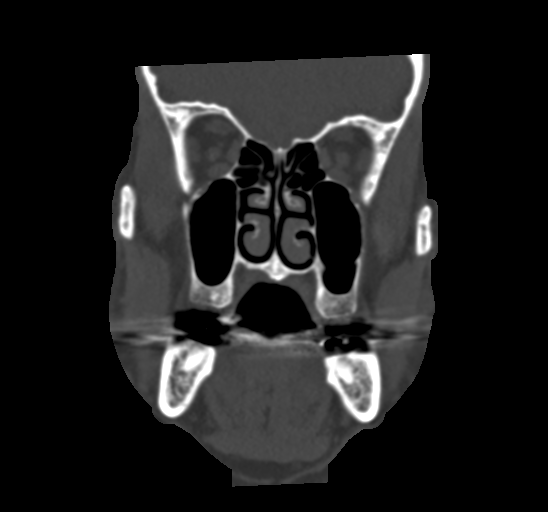

[Series 7: sagittal bone · sagittal · 0.35mm/px · 2 of 95 slices shown]
[im 56/95  bone]
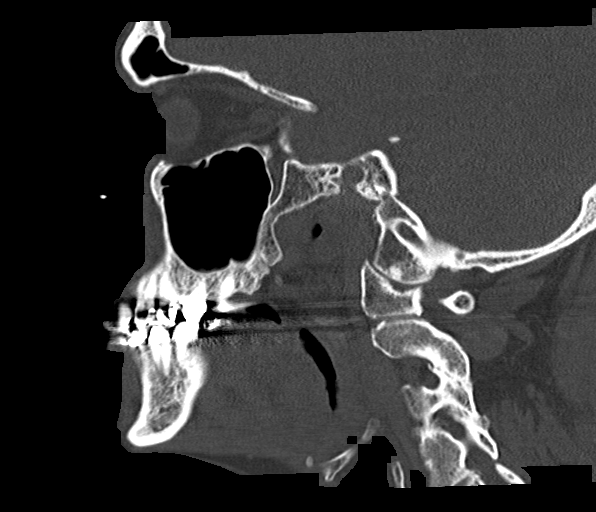
[im 75/95  bone]
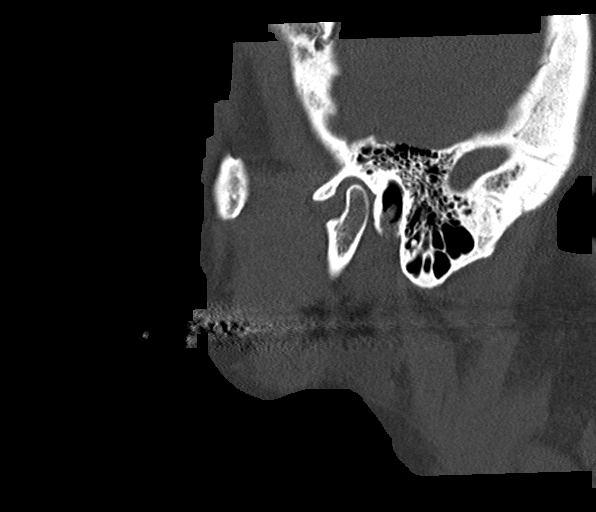

[16 of 47 positions shown; findings below may reference images not displayed]

FINDINGS: Osseous: No fracture or mandibular dislocation. No destructive
process.

Orbits: Negative. No traumatic or inflammatory finding.

Sinuses: Clear.

Soft tissues: There is mild to moderate severity right frontal and
lateral right periorbital soft tissue swelling.

Limited intracranial: No significant or unexpected finding.
IMPRESSION: 1. Mild to moderate severity right frontal and lateral right
periorbital soft tissue swelling.
2. No evidence of acute facial bone fracture.

## 2022-05-14 DIAGNOSIS — I10 Essential (primary) hypertension: Secondary | ICD-10-CM | POA: Diagnosis not present

## 2022-05-14 DIAGNOSIS — E785 Hyperlipidemia, unspecified: Secondary | ICD-10-CM | POA: Diagnosis not present

## 2022-05-16 ENCOUNTER — Other Ambulatory Visit: Payer: Self-pay | Admitting: Cardiology

## 2022-05-29 DIAGNOSIS — R351 Nocturia: Secondary | ICD-10-CM | POA: Diagnosis not present

## 2022-05-29 DIAGNOSIS — Z125 Encounter for screening for malignant neoplasm of prostate: Secondary | ICD-10-CM | POA: Diagnosis not present

## 2022-05-29 DIAGNOSIS — N401 Enlarged prostate with lower urinary tract symptoms: Secondary | ICD-10-CM | POA: Diagnosis not present

## 2022-05-29 DIAGNOSIS — N201 Calculus of ureter: Secondary | ICD-10-CM | POA: Diagnosis not present

## 2022-06-11 ENCOUNTER — Ambulatory Visit: Payer: Medicare HMO | Admitting: Podiatry

## 2022-06-11 ENCOUNTER — Encounter: Payer: Self-pay | Admitting: Podiatry

## 2022-06-11 DIAGNOSIS — M79675 Pain in left toe(s): Secondary | ICD-10-CM | POA: Diagnosis not present

## 2022-06-11 DIAGNOSIS — I739 Peripheral vascular disease, unspecified: Secondary | ICD-10-CM

## 2022-06-11 DIAGNOSIS — M79674 Pain in right toe(s): Secondary | ICD-10-CM | POA: Diagnosis not present

## 2022-06-11 DIAGNOSIS — Q828 Other specified congenital malformations of skin: Secondary | ICD-10-CM

## 2022-06-11 DIAGNOSIS — B351 Tinea unguium: Secondary | ICD-10-CM | POA: Diagnosis not present

## 2022-06-11 DIAGNOSIS — L84 Corns and callosities: Secondary | ICD-10-CM | POA: Diagnosis not present

## 2022-06-13 NOTE — Progress Notes (Signed)
  Subjective:  Patient ID: Terry Villanueva, male    DOB: [birth year],  MRN: [id number]  Terry Villanueva presents to clinic today for:  Chief Complaint  Patient presents with   Nail Problem    Routine foot care   New problem(s): None.   PCP is Angelina Sheriff, MD.  No Known Allergies  Review of Systems: Negative except as noted in the HPI.  Objective: No changes noted in today's physical examination.  Terry Villanueva is a pleasant 86 y.o. male WD, WN in NAD. AAO x 3.  Vascular Examination: CFT <3 seconds b/l. DP pulses faintly palpable b/l. PT pulses diminished b/l. Digital hair absent. Skin temperature gradient warm to warm b/l. No pain with calf compression. No ischemia or gangrene. No cyanosis or clubbing noted b/l. Varicosities present b/l.   Neurological Examination: Sensation grossly intact b/l with 10 gram monofilament.   Dermatological Examination: Pedal skin warm and supple b/l. Toenails 1-5 b/l thick, discolored, elongated with subungual debris and pain on dorsal palpation.  Pedal skin is warm and supple b/l LE. Hyperkeratotic lesion(s) submet head 5 right foot.  No erythema, no edema, no drainage, no fluctuance. Porokeratotic lesion(s) submet head 5 left foot. No erythema, no edema, no drainage, no fluctuance.  Musculoskeletal Examination: Muscle strength 5/5 to b/l LE.  Cavovarus foot type secondary to Polio. Ligamentous laxity noted to the right ankle.  Radiographs: None  Assessment/Plan: 1. Pain due to onychomycosis of toenails of both feet   2. Callus   3. Porokeratosis   4. PAD (peripheral artery disease) ([place])     No orders of the defined types were placed in this encounter.   -Patient was evaluated and treated. All patient's and/or POA's questions/concerns answered on today's visit. -Patient to continue soft, supportive shoe gear daily. -Toenails 1-5 b/l were debrided in length and girth with sterile nail nippers and dremel without iatrogenic bleeding.   -Callus(es) submet head 5 right foot pared utilizing sterile scalpel blade without complication or incident. Total number debrided =1. -Porokeratotic lesion(s) submet head 5 left foot pared and enucleated with sterile currette without incident. Total number of lesions debrided=1. -Patient/POA to call should there be question/concern in the interim.   Return in about 3 months (around 09/10/2022).  Marzetta Board, DPM

## 2022-07-02 DIAGNOSIS — L821 Other seborrheic keratosis: Secondary | ICD-10-CM | POA: Diagnosis not present

## 2022-07-02 DIAGNOSIS — D225 Melanocytic nevi of trunk: Secondary | ICD-10-CM | POA: Diagnosis not present

## 2022-07-02 DIAGNOSIS — L814 Other melanin hyperpigmentation: Secondary | ICD-10-CM | POA: Diagnosis not present

## 2022-07-02 DIAGNOSIS — L570 Actinic keratosis: Secondary | ICD-10-CM | POA: Diagnosis not present

## 2022-07-03 DIAGNOSIS — Z6824 Body mass index (BMI) 24.0-24.9, adult: Secondary | ICD-10-CM | POA: Diagnosis not present

## 2022-07-03 DIAGNOSIS — N402 Nodular prostate without lower urinary tract symptoms: Secondary | ICD-10-CM | POA: Diagnosis not present

## 2022-07-03 DIAGNOSIS — N410 Acute prostatitis: Secondary | ICD-10-CM | POA: Diagnosis not present

## 2022-07-17 ENCOUNTER — Telehealth: Payer: Self-pay | Admitting: Cardiology

## 2022-07-17 NOTE — Telephone Encounter (Signed)
Pt c/o Shortness Of Breath: STAT if SOB developed within the last 24 hours or pt is noticeably SOB on the phone  1. Are you currently SOB (can you hear that pt is SOB on the phone)?  No   2. How long have you been experiencing SOB?  Past 2-3 weeks  3. Are you SOB when sitting or when up moving around?  When up and moving around, mainly in the morning  4. Are you currently experiencing any other symptoms?  No

## 2022-07-17 NOTE — Telephone Encounter (Signed)
Spoke with patient who states he has been experiencing some SOB over last few weeks, usually worse in the morning. Denies cough, wheezing. States his heart rate is in the 70s and EKG shows sinus rhythm. Patient states he was treated for a UTI 2 weeks ago and that is when he first noticed the SOB, and also the SOB is becoming less frequent.    Advised patient to continue to monitor his EKG and HR for afib and/or palpitations. Call us back if symptoms persist. Go to the ED if he develops chest pain, increased SOB, palpitations and nausea. Patient verbalized understanding.

## 2022-07-30 DIAGNOSIS — M170 Bilateral primary osteoarthritis of knee: Secondary | ICD-10-CM | POA: Diagnosis not present

## 2022-08-05 DIAGNOSIS — M170 Bilateral primary osteoarthritis of knee: Secondary | ICD-10-CM | POA: Diagnosis not present

## 2022-08-05 DIAGNOSIS — M1712 Unilateral primary osteoarthritis, left knee: Secondary | ICD-10-CM | POA: Diagnosis not present

## 2022-08-05 DIAGNOSIS — M1711 Unilateral primary osteoarthritis, right knee: Secondary | ICD-10-CM | POA: Diagnosis not present

## 2022-08-09 ENCOUNTER — Other Ambulatory Visit: Payer: Self-pay | Admitting: Cardiology

## 2022-08-09 DIAGNOSIS — I480 Paroxysmal atrial fibrillation: Secondary | ICD-10-CM

## 2022-08-10 NOTE — Telephone Encounter (Signed)
Prescription refill request for Eliquis received. Indication:afib Last office visit:7/23 Scr:0.7 Age: 86 Weight:85.7  kg  Prescription refilled

## 2022-08-12 DIAGNOSIS — M170 Bilateral primary osteoarthritis of knee: Secondary | ICD-10-CM | POA: Diagnosis not present

## 2022-08-13 DIAGNOSIS — E785 Hyperlipidemia, unspecified: Secondary | ICD-10-CM | POA: Diagnosis not present

## 2022-08-13 DIAGNOSIS — H25813 Combined forms of age-related cataract, bilateral: Secondary | ICD-10-CM | POA: Diagnosis not present

## 2022-08-13 DIAGNOSIS — H524 Presbyopia: Secondary | ICD-10-CM | POA: Diagnosis not present

## 2022-08-13 DIAGNOSIS — H3530 Unspecified macular degeneration: Secondary | ICD-10-CM | POA: Diagnosis not present

## 2022-08-13 DIAGNOSIS — H52229 Regular astigmatism, unspecified eye: Secondary | ICD-10-CM | POA: Diagnosis not present

## 2022-08-13 DIAGNOSIS — Z0100 Encounter for examination of eyes and vision without abnormal findings: Secondary | ICD-10-CM | POA: Diagnosis not present

## 2022-08-13 DIAGNOSIS — H353131 Nonexudative age-related macular degeneration, bilateral, early dry stage: Secondary | ICD-10-CM | POA: Diagnosis not present

## 2022-08-13 DIAGNOSIS — H35373 Puckering of macula, bilateral: Secondary | ICD-10-CM | POA: Diagnosis not present

## 2022-08-13 DIAGNOSIS — I10 Essential (primary) hypertension: Secondary | ICD-10-CM | POA: Diagnosis not present

## 2022-08-13 DIAGNOSIS — H5200 Hypermetropia, unspecified eye: Secondary | ICD-10-CM | POA: Diagnosis not present

## 2022-08-14 DIAGNOSIS — Z23 Encounter for immunization: Secondary | ICD-10-CM | POA: Diagnosis not present

## 2022-08-31 DIAGNOSIS — R1031 Right lower quadrant pain: Secondary | ICD-10-CM | POA: Diagnosis not present

## 2022-08-31 DIAGNOSIS — R109 Unspecified abdominal pain: Secondary | ICD-10-CM | POA: Diagnosis not present

## 2022-08-31 DIAGNOSIS — R1011 Right upper quadrant pain: Secondary | ICD-10-CM | POA: Diagnosis not present

## 2022-08-31 DIAGNOSIS — Z87442 Personal history of urinary calculi: Secondary | ICD-10-CM | POA: Diagnosis not present

## 2022-09-02 DIAGNOSIS — N503 Cyst of epididymis: Secondary | ICD-10-CM | POA: Diagnosis not present

## 2022-09-02 DIAGNOSIS — N50811 Right testicular pain: Secondary | ICD-10-CM | POA: Diagnosis not present

## 2022-09-02 DIAGNOSIS — N433 Hydrocele, unspecified: Secondary | ICD-10-CM | POA: Diagnosis not present

## 2022-09-02 DIAGNOSIS — N442 Benign cyst of testis: Secondary | ICD-10-CM | POA: Diagnosis not present

## 2022-09-24 ENCOUNTER — Ambulatory Visit: Payer: Medicare HMO | Admitting: Podiatry

## 2022-09-24 ENCOUNTER — Encounter: Payer: Self-pay | Admitting: Podiatry

## 2022-09-24 VITALS — BP 168/84

## 2022-09-24 DIAGNOSIS — B351 Tinea unguium: Secondary | ICD-10-CM

## 2022-09-24 DIAGNOSIS — Q828 Other specified congenital malformations of skin: Secondary | ICD-10-CM

## 2022-09-24 DIAGNOSIS — L84 Corns and callosities: Secondary | ICD-10-CM

## 2022-09-24 DIAGNOSIS — M79674 Pain in right toe(s): Secondary | ICD-10-CM

## 2022-09-24 DIAGNOSIS — I739 Peripheral vascular disease, unspecified: Secondary | ICD-10-CM

## 2022-09-24 DIAGNOSIS — M79675 Pain in left toe(s): Secondary | ICD-10-CM | POA: Diagnosis not present

## 2022-09-24 NOTE — Progress Notes (Signed)
  Subjective:  Patient ID: Terry Villanueva, male    DOB: 12/23/1935,  MRN: [id number]  Terry Villanueva presents to clinic today for at risk foot care. Patient has h/o PAD and painful porokeratotic lesion(s) b/l lower extremities and painful mycotic toenails that limit ambulation. Painful toenails interfere with ambulation. Aggravating factors include wearing enclosed shoe gear. Pain is relieved with periodic professional debridement. Painful porokeratotic lesions are aggravated when weightbearing with and without shoegear. Pain is relieved with periodic professional debridement.  Chief Complaint  Patient presents with   Nail Problem    Nail Trim  Not Diabetic  PCP - Dr Terry Villanueva , last OV 11/23   New problem(s): None.   PCP is Terry Sheriff, MD.  No Known Allergies  Review of Systems: Negative except as noted in the HPI.  Objective:  Vitals:   09/24/22 1604  BP: (!) 168/84   Terry Villanueva is a pleasant 87 y.o. male WD, WN in NAD. AAO x 3.  Vascular Examination: CFT <3 seconds b/l. DP pulses faintly palpable b/l. PT pulses diminished b/l. Digital hair absent. Skin temperature gradient warm to warm b/l. No pain with calf compression. No ischemia or gangrene. No cyanosis or clubbing noted b/l. Varicosities present b/l.   Neurological Examination: Sensation grossly intact b/l with 10 gram monofilament.   Dermatological Examination: Pedal skin warm and supple b/l.   Toenails 1-5 b/l thick, discolored, elongated with subungual debris and pain on dorsal palpation.  Pedal skin is warm and supple b/l LE.   Porokeratotic lesion(s) submet head 5 b/l. No erythema, no edema, no drainage, no fluctuance.  Musculoskeletal Examination: Muscle strength 5/5 to b/l LE. Cavovarus foot type secondary to Polio. Ligamentous laxity noted to the right ankle.  Radiographs: None  Assessment/Plan: 1. Pain due to onychomycosis of toenails of both feet   2. Porokeratosis   3. PAD (peripheral artery  disease) ([place])     No orders of the defined types were placed in this encounter.   -Consent given for treatment as described below: -Examined patient. -Continue supportive shoe gear daily. -Mycotic toenails 1-5 bilaterally were debrided in length and girth with sterile nail nippers and dremel without incident. -Porokeratotic lesion(s) submet head 5 b/l pared and enucleated with sterile currette without incident. Total number of lesions debrided=2. -Patient/POA to call should there be question/concern in the interim.   Return in about 3 months (around 12/24/2022).  Terry Villanueva, DPM

## 2022-09-25 ENCOUNTER — Other Ambulatory Visit: Payer: Self-pay | Admitting: Gastroenterology

## 2022-10-20 DIAGNOSIS — Z1331 Encounter for screening for depression: Secondary | ICD-10-CM | POA: Diagnosis not present

## 2022-10-20 DIAGNOSIS — M899 Disorder of bone, unspecified: Secondary | ICD-10-CM | POA: Diagnosis not present

## 2022-10-20 DIAGNOSIS — Z0000 Encounter for general adult medical examination without abnormal findings: Secondary | ICD-10-CM | POA: Diagnosis not present

## 2022-10-20 DIAGNOSIS — M8589 Other specified disorders of bone density and structure, multiple sites: Secondary | ICD-10-CM | POA: Diagnosis not present

## 2022-10-20 DIAGNOSIS — Z79899 Other long term (current) drug therapy: Secondary | ICD-10-CM | POA: Diagnosis not present

## 2022-10-20 DIAGNOSIS — Z6825 Body mass index (BMI) 25.0-25.9, adult: Secondary | ICD-10-CM | POA: Diagnosis not present

## 2022-10-20 DIAGNOSIS — E785 Hyperlipidemia, unspecified: Secondary | ICD-10-CM | POA: Diagnosis not present

## 2022-11-05 DIAGNOSIS — M79641 Pain in right hand: Secondary | ICD-10-CM

## 2022-11-05 DIAGNOSIS — R52 Pain, unspecified: Secondary | ICD-10-CM | POA: Insufficient documentation

## 2022-11-05 DIAGNOSIS — M79642 Pain in left hand: Secondary | ICD-10-CM | POA: Diagnosis not present

## 2022-11-05 DIAGNOSIS — M65842 Other synovitis and tenosynovitis, left hand: Secondary | ICD-10-CM | POA: Diagnosis not present

## 2022-11-05 DIAGNOSIS — M65841 Other synovitis and tenosynovitis, right hand: Secondary | ICD-10-CM | POA: Diagnosis not present

## 2022-11-05 HISTORY — DX: Pain in right hand: M79.641

## 2022-11-05 HISTORY — DX: Pain, unspecified: R52

## 2022-11-12 DIAGNOSIS — D1801 Hemangioma of skin and subcutaneous tissue: Secondary | ICD-10-CM | POA: Diagnosis not present

## 2022-11-12 DIAGNOSIS — L111 Transient acantholytic dermatosis [Grover]: Secondary | ICD-10-CM | POA: Diagnosis not present

## 2022-11-12 DIAGNOSIS — L570 Actinic keratosis: Secondary | ICD-10-CM | POA: Diagnosis not present

## 2022-11-12 DIAGNOSIS — L814 Other melanin hyperpigmentation: Secondary | ICD-10-CM | POA: Diagnosis not present

## 2022-11-24 DIAGNOSIS — H6123 Impacted cerumen, bilateral: Secondary | ICD-10-CM | POA: Diagnosis not present

## 2022-11-24 DIAGNOSIS — H61303 Acquired stenosis of external ear canal, unspecified, bilateral: Secondary | ICD-10-CM | POA: Diagnosis not present

## 2022-12-13 DIAGNOSIS — M8580 Other specified disorders of bone density and structure, unspecified site: Secondary | ICD-10-CM | POA: Diagnosis not present

## 2022-12-13 DIAGNOSIS — I4891 Unspecified atrial fibrillation: Secondary | ICD-10-CM | POA: Diagnosis not present

## 2022-12-13 DIAGNOSIS — K219 Gastro-esophageal reflux disease without esophagitis: Secondary | ICD-10-CM | POA: Diagnosis not present

## 2022-12-18 DIAGNOSIS — K5909 Other constipation: Secondary | ICD-10-CM | POA: Diagnosis not present

## 2022-12-18 DIAGNOSIS — N401 Enlarged prostate with lower urinary tract symptoms: Secondary | ICD-10-CM | POA: Diagnosis not present

## 2022-12-24 DIAGNOSIS — K5900 Constipation, unspecified: Secondary | ICD-10-CM | POA: Diagnosis not present

## 2022-12-24 DIAGNOSIS — Z6825 Body mass index (BMI) 25.0-25.9, adult: Secondary | ICD-10-CM | POA: Diagnosis not present

## 2022-12-24 DIAGNOSIS — R0609 Other forms of dyspnea: Secondary | ICD-10-CM | POA: Diagnosis not present

## 2022-12-25 ENCOUNTER — Telehealth: Payer: Self-pay

## 2022-12-25 NOTE — Telephone Encounter (Signed)
12/24/2022 primary care office note received for Dr Dulce Sellar. Forward to Dr Dulce Sellar to review

## 2022-12-28 ENCOUNTER — Telehealth: Payer: Self-pay

## 2022-12-28 ENCOUNTER — Other Ambulatory Visit: Payer: Self-pay

## 2022-12-28 ENCOUNTER — Telehealth: Payer: Self-pay | Admitting: Cardiology

## 2022-12-28 NOTE — Progress Notes (Signed)
Cardiology Office Note:    Date:  12/29/2022   ID:  Terry Villanueva, DOB March 02, 1936, MRN [id number]  PCP:  Terry Saupe, MD   [facility] Providers Cardiologist:  Terry Herrlich, MD     Referring MD: Terry Saupe, MD   CC: SOB  History of Present Illness:    Terry Villanueva is a 87 y.o. male with a hx of paroxysmal atrial fibrillation, hypertension, GERD, hypothyroidism.  First occurrence of atrial fibrillation occurred in 2018 surrounding a hospitalization, he had a slight troponin rise, stress test was unremarkable.  He spontaneously converted back to sinus rhythm.  Anticoagulated with Eliquis.  Most recently he was evaluated by Dr. Dulce Villanueva on 04/08/2022 at that time he was doing well from a cardiac perspective.  No recurrence of atrial fibrillation, his beta-blocker and DOAC were continued.  Terry Villanueva notified our triage line on 12/28/2022 reported new shortness of breath with exertion over the last 3 to 4 weeks.  He presents today for follow-up of his shortness of breath, states that has been most notable over the last month but may have been occurring a little longer than that.  It is most notable when he is exerting himself, or walking up an incline.  He has been weighing himself daily, does not recall what his weights are, but does feel like he has gained weight in his abdomen, citing bloating.  He notices that his ankles are more swollen than they typically are as well. He denies chest pain, palpitations, dyspnea, pnd, orthopnea, n, v, dizziness, syncope, or early satiety.  Denies hematuria, hematochezia, hemoptysis, epistaxis.  Past Medical History:  Diagnosis Date   Arthritis 02/21/2018   Carpal tunnel syndrome of left wrist 08/28/2014   Cervical radiculopathy 08/16/2014   Erectile dysfunction due to arterial insufficiency 07/24/2015   Esophageal dysphagia    Essential hypertension 06/07/2017   GERD (gastroesophageal reflux disease) 02/21/2018   Heart  disease 07/17/2021   Hypertensive heart disease 06/07/2017   Hypothyroidism 02/21/2018   Impingement syndrome of left shoulder region 09/20/2018   Kidney stone 01/2022   Long term current use of anticoagulant 12/19/2016   PAF (paroxysmal atrial fibrillation) 06/07/2017   Polio    1952   Prostate nodule 07/24/2015   Sciatica     Past Surgical History:  Procedure Laterality Date   BACK SURGERY     COLONOSCOPY  12/08/2010   Mild sigmoid diverticulosis. Small internal hemorrhoids   ESOPHAGOGASTRODUODENOSCOPY  07/08/2015   Schatzki ring status post esophageal dilatation. Small hiatal hernia   FLEXIBLE SIGMOIDOSCOPY  1999   Dr Terry Villanueva   KNEE SURGERY     STAPEDES SURGERY     VASECTOMY     WRIST SURGERY Left 07/2016   Carpal Tunnel    Current Medications: Current Meds  Medication Sig   apixaban (ELIQUIS) 5 MG TABS tablet TAKE 1 TABLET BY MOUTH TWICE A DAY   levothyroxine (SYNTHROID) 50 MCG tablet Take 50 mcg by mouth daily.   metoprolol succinate (TOPROL-XL) 25 MG 24 hr tablet TAKE 1 TABLET BY MOUTH EVERY DAY   Multiple Vitamins-Minerals (MACULAR HEALTH FORMULA PO) Take 1 tablet by mouth 2 (two) times daily.    omeprazole (PRILOSEC) 20 MG capsule TAKE 1 CAPSULE BY MOUTH EVERY DAY (Patient taking differently: Take 20 mg by mouth every other day.)   tamsulosin (FLOMAX) 0.4 MG CAPS capsule Take 0.4 mg by mouth daily.   vitamin C (ASCORBIC ACID) 500 MG tablet Take 1,000 mg by mouth  daily.   [DISCONTINUED] diltiazem (CARDIZEM) 30 MG tablet TAKE 1 TABLET (30 MG TOTAL) BY MOUTH EVERY 8 (EIGHT) HOURS AS NEEDED (FOR HEART RATE GREATER THAN 110 BPM).   Current Facility-Administered Medications for the 12/29/22 encounter (Office Visit) with Flossie Dibble, NP  Medication   0.9 %  sodium chloride infusion     Allergies:   Patient has no known allergies.   Social History   Socioeconomic History   Marital status: Married    Spouse name: Not on file   Number of children: Not on file    Years of education: Not on file   Highest education level: Not on file  Occupational History   Not on file  Tobacco Use   Smoking status: Never   Smokeless tobacco: Never  Vaping Use   Vaping Use: Never used  Substance and Sexual Activity   Alcohol use: Not Currently   Drug use: Not Currently   Sexual activity: Not on file  Other Topics Concern   Not on file  Social History Narrative   Not on file   Social Determinants of Health   Financial Resource Strain: Not on file  Food Insecurity: Not on file  Transportation Needs: Not on file  Physical Activity: Not on file  Stress: Not on file  Social Connections: Not on file     Family History: The patient's family history includes Cancer in his brother and mother; Diabetes in his mother; Heart disease in his brother, father, and mother; Hypertension in his brother; Stroke in his mother. There is no history of Colon cancer, Rectal cancer, or Esophageal cancer.  ROS:   Please see the history of present illness.     All other systems reviewed and are negative.  EKGs/Labs/Other Studies Reviewed:    The following studies were reviewed today:  Echocardiogram 11/30/2016-EF 60 to 65%, mild dilatation of left atrium, mild to moderate MR, mild TR.  EKG:  EKG is  ordered today.  The ekg ordered today demonstrates normal sinus rhythm with left axis deviation, heart rate 72 bpm.  Consistent with prior EKG tracings  Recent Labs: No results found for requested labs within last 365 days.  Recent Lipid Panel No results found for: "CHOL", "TRIG", "HDL", "CHOLHDL", "VLDL", "LDLCALC", "LDLDIRECT"   Risk Assessment/Calculations:    CHA2DS2-VASc Score = 3   This indicates a 3.2% annual risk of stroke. The patient's score is based upon: CHF History: 0 HTN History: 1 Diabetes History: 0 Stroke History: 0 Vascular Disease History: 0 Age Score: 2 Gender Score: 0               Physical Exam:    VS:  BP 120/60   Pulse 72   Ht 6'  (1.829 m)   Wt 189 lb 9.6 oz (86 kg)   SpO2 96%   BMI 25.71 kg/m     Wt Readings from Last 3 Encounters:  12/29/22 189 lb 9.6 oz (86 kg)  04/08/22 189 lb (85.7 kg)  07/21/21 193 lb (87.5 kg)     GEN:  Well nourished, well developed in no acute distress HEENT: Normal NECK: No JVD; No carotid bruits LYMPHATICS: No lymphadenopathy CARDIAC: RRR, no murmurs, rubs, gallops RESPIRATORY: Rales noted right lower lung, left  lung clear to auscultation and wheezing or rhonchi  ABDOMEN: Soft, non-tender, non-distended MUSCULOSKELETAL: +1 pitting edema bilaterally to patella; left lower ankle in brace due to polio SKIN: Warm and dry NEUROLOGIC:  Alert and oriented x 3 PSYCHIATRIC:  Normal affect   ASSESSMENT:    1. DOE (dyspnea on exertion)   2. PAF (paroxysmal atrial fibrillation)   3. Long term current use of anticoagulant   4. Hypertensive heart disease without heart failure    PLAN:    In order of problems listed above:  DOE/SOB-gradually persistent over the last 1 to 2 months, most notable when he is exerting himself.  Does not appear to have significant weight gain, he does complain of abdominal bloating along with pedal edema.  Most recent echo in 2018 showed a normal EF, mild to moderate MR, mild TR.  Will repeat echo cardiogram, check BNP, BMET, CBC.  Rales appreciated upon evaluation, Lasix 20 mg p.o. x 2 days, then as needed for weight gain of 3 pounds over 1 day or 5 pounds per week.  Will repeat BMET in 1 week.  Paroxysmal atrial fibrillation -no recurrence of A-fib since 2019.  EKG reveals sinus rhythm today, heart rate 72 bpm.  Continue Eliquis 5 mg twice a day, no indication for dose reduction.  Continue metoprolol 25 mg daily.  Will repeat BMET, CBC.  He has as needed diltiazem for sustained heart rate greater than 110, however he has not needed this.  Hypertension-blood pressure is well-controlled today at 120/60, continue metoprolol 20 mg daily.    Disposition-echocardiogram, BNP, BMET, CBC, Lasix 20 mg p.o. x 2 days and as needed for weight gain.  Return in 1 week for repeat BMET following diuresing.  Return in 1 month.       Medication Adjustments/Labs and Tests Ordered: Current medicines are reviewed at length with the patient today.  Concerns regarding medicines are outlined above.  Orders Placed This Encounter  Procedures   Basic metabolic panel   CBC with Differential/Platelet   Pro b natriuretic peptide (BNP)   Basic metabolic panel   EKG 12-Lead   ECHOCARDIOGRAM COMPLETE   Meds ordered this encounter  Medications   diltiazem (CARDIZEM) 30 MG tablet    Sig: Take 1 tablet (30 mg total) by mouth every 8 (eight) hours as needed (for heart rate greater than 110 BPM).    Dispense:  90 tablet    Refill:  3    There are no Patient Instructions on file for this visit.   Signed, Flossie Dibble, NP  12/29/2022 3:57 PM    [facility]

## 2022-12-28 NOTE — Telephone Encounter (Signed)
Called patient and he reported that he was having SOB with any exertion. He states that when he is sitting he is not short of breath. This shortness of breath has been happening for 3 - 4 weeks. Patient denies any light headedness, dizziness, syncope or chest pain. Patient has an appointment to see Wallis Bamberg the nurse practitioner on 4/16. Patient states that he is fine to wait until he can see the nurse practitioner tomorrow. Patient had no further questions at this time.

## 2022-12-28 NOTE — Telephone Encounter (Signed)
Received stat phone call stating patient has shortness of breath. Patient stated he has experienced shortness of breath for a couple of months however, it has not worsen. While on the phone with the patient he had an incoming call from another doctor's office. Will forward to [facility] triage.

## 2022-12-28 NOTE — Telephone Encounter (Signed)
Pt c/o Shortness Of Breath: STAT if SOB developed within the last 24 hours or pt is noticeably SOB on the phone  1. Are you currently SOB (can you hear that pt is SOB on the phone)? He states not right now, since he's sitting. The last time he had the sob was this morning, while walking at [employer]. He states if he has a cart to hold on to, the sob isn't as bad.  2. How long have you been experiencing SOB? He states for about 3-4 wks, maybe longer.   3. Are you SOB when sitting or when up moving around? With any exertion, mainly just walking, or something like reaching up into the closet.  4. Are you currently experiencing any other symptoms? Pt denies any other sx - lightheadedness, dizziness, syncope, cp, headache   I scheduled the pt with Wallis Bamberg NP for tomorrow 4/16.   Call transferred to triage for ch st after no success for [place].

## 2022-12-29 ENCOUNTER — Ambulatory Visit: Payer: Medicare HMO | Attending: Cardiology | Admitting: Cardiology

## 2022-12-29 ENCOUNTER — Encounter: Payer: Self-pay | Admitting: Cardiology

## 2022-12-29 VITALS — BP 120/60 | HR 72 | Ht 72.0 in | Wt 189.6 lb

## 2022-12-29 DIAGNOSIS — I480 Paroxysmal atrial fibrillation: Secondary | ICD-10-CM | POA: Diagnosis not present

## 2022-12-29 DIAGNOSIS — Z7901 Long term (current) use of anticoagulants: Secondary | ICD-10-CM | POA: Diagnosis not present

## 2022-12-29 DIAGNOSIS — I119 Hypertensive heart disease without heart failure: Secondary | ICD-10-CM | POA: Diagnosis not present

## 2022-12-29 DIAGNOSIS — R0609 Other forms of dyspnea: Secondary | ICD-10-CM | POA: Diagnosis not present

## 2022-12-29 MED ORDER — FUROSEMIDE 20 MG PO TABS: 20.0000 mg | ORAL_TABLET | Freq: Every day | ORAL | 3 refills | Status: DC

## 2022-12-29 MED ORDER — DILTIAZEM HCL 30 MG PO TABS: 30.0000 mg | ORAL_TABLET | Freq: Three times a day (TID) | ORAL | 3 refills | Status: DC | PRN

## 2022-12-29 NOTE — Patient Instructions (Signed)
Medication Instructions:  Your physician has recommended you make the following change in your medication:  Start Lasix 20 mg once daily for 2 days then as needed for weight gain of 3 lbs in 1 night  *If you need a refill on your cardiac medications before your next appointment, please call your pharmacy*   Lab Work: Your physician recommends that you return for lab work in: Today for a BMP, CBC and Pro BNP Then come back in 1 week for a BMP Lab opens at 8am. You DO NOT NEED an appointment. Best time to come is between 8am and 12noon and between 1:30 and 4:30. If you have been asked to fast for your blood work please have nothing to eat or drink after midnight. You may have water.   If you have labs (blood work) drawn today and your tests are completely normal, you will receive your results only by: MyChart Message (if you have MyChart) OR A paper copy in the mail If you have any lab test that is abnormal or we need to change your treatment, we will call you to review the results.   Testing/Procedures: Your physician has requested that you have an echocardiogram. Echocardiography is a painless test that uses sound waves to create images of your heart. It provides your doctor with information about the size and shape of your heart and how well your heart's chambers and valves are working. This procedure takes approximately one hour. There are no restrictions for this procedure. Please do NOT wear cologne, perfume, aftershave, or lotions (deodorant is allowed). Please arrive 15 minutes prior to your appointment time.    Follow-Up: At [facility], you and your health needs are our priority.  As part of our continuing mission to provide you with exceptional heart care, we have created designated Provider Care Teams.  These Care Teams include your primary Cardiologist (physician) and Advanced Practice Providers (APPs -  Physician Assistants and Nurse Practitioners) who all work  together to provide you with the care you need, when you need it.  We recommend signing up for the patient portal called "MyChart".  Sign up information is provided on this After Visit Summary.  MyChart is used to connect with patients for Virtual Visits (Telemedicine).  Patients are able to view lab/test results, encounter notes, upcoming appointments, etc.  Non-urgent messages can be sent to your provider as well.   To learn more about what you can do with MyChart, go to [email or web address].    Your next appointment:   1 month(s)  Provider:   Wallis Bamberg, NP Rosalita Levan)    Other Instructions

## 2022-12-30 ENCOUNTER — Other Ambulatory Visit: Payer: Self-pay

## 2022-12-30 LAB — CBC WITH DIFFERENTIAL/PLATELET
Basophils Absolute: 0 x10E3/uL (ref 0.0–0.2)
Basos: 0 %
EOS (ABSOLUTE): 0.2 x10E3/uL (ref 0.0–0.4)
Eos: 2 %
Hematocrit: 40 % (ref 37.5–51.0)
Hemoglobin: 13.7 g/dL (ref 13.0–17.7)
Immature Grans (Abs): 0 x10E3/uL (ref 0.0–0.1)
Immature Granulocytes: 0 %
Lymphocytes Absolute: 1.8 x10E3/uL (ref 0.7–3.1)
Lymphs: 28 %
MCH: 32.5 pg (ref 26.6–33.0)
MCHC: 34.3 g/dL (ref 31.5–35.7)
MCV: 95 fL (ref 79–97)
Monocytes Absolute: 0.6 x10E3/uL (ref 0.1–0.9)
Monocytes: 9 %
Neutrophils Absolute: 4 x10E3/uL (ref 1.4–7.0)
Neutrophils: 61 %
Platelets: 266 x10E3/uL (ref 150–450)
RBC: 4.22 x10E6/uL (ref 4.14–5.80)
RDW: 12.2 % (ref 11.6–15.4)
WBC: 6.6 x10E3/uL (ref 3.4–10.8)

## 2022-12-30 LAB — BASIC METABOLIC PANEL WITH GFR
BUN/Creatinine Ratio: 27 — ABNORMAL HIGH (ref 10–24)
BUN: 22 mg/dL (ref 8–27)
CO2: 21 mmol/L (ref 20–29)
Calcium: 9.6 mg/dL (ref 8.6–10.2)
Chloride: 105 mmol/L (ref 96–106)
Creatinine, Ser: 0.83 mg/dL (ref 0.76–1.27)
Glucose: 151 mg/dL — ABNORMAL HIGH (ref 70–99)
Potassium: 4.1 mmol/L (ref 3.5–5.2)
Sodium: 142 mmol/L (ref 134–144)
eGFR: 86 mL/min/1.73

## 2022-12-30 LAB — PRO B NATRIURETIC PEPTIDE: NT-Pro BNP: 207 pg/mL (ref 0–486)

## 2022-12-31 ENCOUNTER — Ambulatory Visit: Payer: Medicare HMO | Admitting: Podiatry

## 2022-12-31 ENCOUNTER — Encounter: Payer: Self-pay | Admitting: Podiatry

## 2022-12-31 DIAGNOSIS — M79674 Pain in right toe(s): Secondary | ICD-10-CM

## 2022-12-31 DIAGNOSIS — B351 Tinea unguium: Secondary | ICD-10-CM

## 2022-12-31 DIAGNOSIS — Q828 Other specified congenital malformations of skin: Secondary | ICD-10-CM

## 2022-12-31 DIAGNOSIS — I739 Peripheral vascular disease, unspecified: Secondary | ICD-10-CM | POA: Diagnosis not present

## 2022-12-31 DIAGNOSIS — M79675 Pain in left toe(s): Secondary | ICD-10-CM

## 2023-01-01 ENCOUNTER — Telehealth: Payer: Self-pay | Admitting: Cardiology

## 2023-01-01 NOTE — Telephone Encounter (Signed)
Pt c/o BP issue: STAT if pt c/o blurred vision, one-sided weakness or slurred speech  1. What are your last 5 BP readings?  4/19: 96/59 78  2. Are you having any other symptoms (ex. Dizziness, headache, blurred vision, passed out)?  Weakness  3. What is your BP issue?   Patient states his BP was low this morning. He also mentions that he was put on Lasix and lost 5 lbs. He assumes the weight loss may have caused the low BP.

## 2023-01-03 NOTE — Progress Notes (Signed)
  Subjective:  Patient ID: Terry Villanueva, male    DOB: 03/28/1936,  MRN: [id number]  Terry Villanueva presents to clinic today for at risk foot care. Patient has h/o PAD and callus(es) b/l feet and painful thick toenails that are difficult to trim. Painful toenails interfere with ambulation. Aggravating factors include wearing enclosed shoe gear. Pain is relieved with periodic professional debridement. Painful calluses are aggravated when weightbearing with and without shoegear. Pain is relieved with periodic professional debridement.  Chief Complaint  Patient presents with   routine foot care   New problem(s): None.   PCP is Noni Saupe, MD.  No Known Allergies  Review of Systems: Negative except as noted in the HPI.  Objective: No changes noted in today's physical examination. There were no vitals filed for this visit. Ranier Villanueva is a pleasant 87 y.o. male WD, WN in NAD. AAO x 3.  Vascular Examination: CFT <3 seconds b/l. DP pulses faintly palpable b/l. PT pulses diminished b/l. Digital hair absent. Skin temperature gradient warm to warm b/l. No pain with calf compression. No ischemia or gangrene. No cyanosis or clubbing noted b/l. Varicosities present b/l.   Neurological Examination: Sensation grossly intact b/l with 10 gram monofilament.   Dermatological Examination: Pedal skin warm and supple b/l.   Toenails 1-5 b/l thick, discolored, elongated with subungual debris and pain on dorsal palpation.  Pedal skin is warm and supple b/l LE.   Porokeratotic lesion(s) submet head 5 left foot. No erythema, no edema, no drainage, no fluctuance.  Musculoskeletal Examination: Muscle strength 5/5 to b/l LE. Cavovarus foot type secondary to Polio. Ligamentous laxity noted to the right ankle.  Radiographs: None  Assessment/Plan: 1. Pain due to onychomycosis of toenails of both feet   2. Porokeratosis   3. PAD (peripheral artery disease)    -Patient was evaluated and treated. All  patient's and/or POA's questions/concerns answered on today's visit. -Patient to continue soft, supportive shoe gear daily. -Toenails 1-5 b/l were debrided in length and girth with sterile nail nippers and dremel without iatrogenic bleeding.  -Porokeratotic lesion(s) submet head 5 left foot pared and enucleated with sterile currette without incident. Total number of lesions debrided=1. -Patient/POA to call should there be question/concern in the interim.   Return in about 9 weeks (around 03/04/2023).  Freddie Breech, DPM

## 2023-01-04 NOTE — Telephone Encounter (Signed)
Called patient and he reported that his blood pressure was running low. On Friday it was 96/59 HR78 and he reported that he was feeling weak. He stated that he took his blood pressure later on during the day on Friday and it had come up to 109/59. Over the weekend it was 122/76. Patient states that he has not had anymore low blood pressures at this time. Please advise.

## 2023-01-05 DIAGNOSIS — R0609 Other forms of dyspnea: Secondary | ICD-10-CM | POA: Diagnosis not present

## 2023-01-05 DIAGNOSIS — I119 Hypertensive heart disease without heart failure: Secondary | ICD-10-CM | POA: Diagnosis not present

## 2023-01-05 DIAGNOSIS — I480 Paroxysmal atrial fibrillation: Secondary | ICD-10-CM | POA: Diagnosis not present

## 2023-01-05 DIAGNOSIS — Z7901 Long term (current) use of anticoagulants: Secondary | ICD-10-CM | POA: Diagnosis not present

## 2023-01-06 ENCOUNTER — Telehealth: Payer: Self-pay

## 2023-01-06 LAB — BASIC METABOLIC PANEL WITH GFR
BUN/Creatinine Ratio: 18 (ref 10–24)
BUN: 15 mg/dL (ref 8–27)
CO2: 26 mmol/L (ref 20–29)
Calcium: 10.2 mg/dL (ref 8.6–10.2)
Chloride: 101 mmol/L (ref 96–106)
Creatinine, Ser: 0.83 mg/dL (ref 0.76–1.27)
Glucose: 108 mg/dL — ABNORMAL HIGH (ref 70–99)
Potassium: 4.9 mmol/L (ref 3.5–5.2)
Sodium: 139 mmol/L (ref 134–144)
eGFR: 86 mL/min/1.73

## 2023-01-06 NOTE — Telephone Encounter (Signed)
-----   Message from Flossie Dibble, NP sent at 01/06/2023  9:05 AM EDT ----- Rip Harbour he can just continue to take PRN for weight gain, we will wait to see what the echo shows.  Thank you, Terry Villanueva ----- Message ----- From: Heywood Bene, CMA Sent: 01/06/2023   8:54 AM EDT To: Flossie Dibble, NP  Patient says lasix helped a little with sob ----- Message ----- From: Flossie Dibble, NP Sent: 01/06/2023   8:26 AM EDT To: Mickie Bail Ash/Hp Triage  Terry Villanueva, Your repeat lab work was normal. How are you feeling after taking the lasix for a few days? Did you SOB get any better? Best, Terry Villanueva

## 2023-01-06 NOTE — Telephone Encounter (Signed)
Called patient and informed him of Dr. Hulen Shouts recommendation below:  "For now no changes unless this is a recurrent problem"   Patient was agreeable with Dr. Hulen Shouts recommendation and had no further questions at this time.

## 2023-01-06 NOTE — Telephone Encounter (Signed)
Patient notified of results.

## 2023-01-12 ENCOUNTER — Ambulatory Visit: Payer: Medicare HMO

## 2023-01-12 ENCOUNTER — Ambulatory Visit: Payer: Medicare HMO | Attending: Cardiology

## 2023-01-12 DIAGNOSIS — R0609 Other forms of dyspnea: Secondary | ICD-10-CM

## 2023-01-12 DIAGNOSIS — I119 Hypertensive heart disease without heart failure: Secondary | ICD-10-CM

## 2023-01-12 DIAGNOSIS — Z7901 Long term (current) use of anticoagulants: Secondary | ICD-10-CM

## 2023-01-12 DIAGNOSIS — I480 Paroxysmal atrial fibrillation: Secondary | ICD-10-CM | POA: Diagnosis not present

## 2023-01-12 LAB — ECHOCARDIOGRAM COMPLETE: S' Lateral: 3.4 cm

## 2023-01-13 ENCOUNTER — Telehealth: Payer: Self-pay

## 2023-01-13 NOTE — Telephone Encounter (Signed)
-----   Message from Flossie Dibble, NP sent at 01/13/2023  9:02 AM EDT ----- Mr. Viscomi, Your echocardiogram showed normal squeezing, but a little stiff when it relaxes, which is not abnormal with aging. You have some mild mitral valve regurgitation, but these findings would not contribute to being SOB. I know you mentioned the lasix helped a little bit, how is your SOB now?  Best, Victorino Dike

## 2023-01-13 NOTE — Telephone Encounter (Signed)
Patient notified of results.

## 2023-01-25 ENCOUNTER — Other Ambulatory Visit: Payer: Medicare HMO

## 2023-01-28 ENCOUNTER — Ambulatory Visit: Payer: Medicare HMO | Admitting: Cardiology

## 2023-02-14 NOTE — Progress Notes (Signed)
Cardiology Office Note:    Date:  02/15/2023   ID:  Terry Villanueva, DOB Feb 11, 1936, MRN [id number]  PCP:  Noni Saupe, MD   [facility] Providers Cardiologist:  Norman Herrlich, MD     Referring MD: Noni Saupe, MD   CC: follow up paroxysmal atrial fibrillation   History of Present Illness:    Terry Villanueva is a 87 y.o. male with a hx of paroxysmal atrial fibrillation, hypertension, GERD, hypothyroidism.  First occurrence of atrial fibrillation occurred in 2018 surrounding a hospitalization, he had a slight troponin rise, stress test was unremarkable.  He spontaneously converted back to sinus rhythm.  Anticoagulated with Eliquis.  Most recently he was evaluated by Dr. Dulce Sellar on 04/08/2022 at that time he was doing well from a cardiac perspective.  No recurrence of atrial fibrillation, his beta-blocker and DOAC were continued.  He presented to the office 12/29/22 for shortness of breath, most notable when he was exerting himself.  Weight was 189 pounds.  He did endorse a bloating feeling in his abdomen as well as pedal edema.  Lab work was unremarkable, proBNP was 207.  We arranged a repeat echocardiogram which revealed an EF of 60 to 65%, moderate concentric LVH, grade 1 DD, normal PASP, mild MR.  He presents today accompanied by his wife for follow up of his SOB.  He states he is feeling better overall, occasionally noticed some shortness of breath however he attributes this to deconditioning.  He has not noticed the bloating sensation as he did before.  He has needed to take his diuretic once for weight gain of 3 pounds.  He does have some trace pedal edema however wears compression socks and this helps greatly with that. He denies chest pain, palpitations, dyspnea, pnd, orthopnea, n, v, dizziness, syncope, edema, weight gain, or early satiety.  He denies hematochezia, hematuria, hemoptysis.  Past Medical History:  Diagnosis Date   Arthritis 02/21/2018   Carpal tunnel  syndrome of left wrist 08/28/2014   Cervical radiculopathy 08/16/2014   Erectile dysfunction due to arterial insufficiency 07/24/2015   Esophageal dysphagia    Essential hypertension 06/07/2017   GERD (gastroesophageal reflux disease) 02/21/2018   Heart disease 07/17/2021   Hypertensive heart disease 06/07/2017   Hypothyroidism 02/21/2018   Impingement syndrome of left shoulder region 09/20/2018   Kidney stone 01/2022   Long term current use of anticoagulant 12/19/2016   PAF (paroxysmal atrial fibrillation) (HCC) 06/07/2017   Polio    1952   Prostate nodule 07/24/2015   Sciatica     Past Surgical History:  Procedure Laterality Date   BACK SURGERY     COLONOSCOPY  12/08/2010   Mild sigmoid diverticulosis. Small internal hemorrhoids   ESOPHAGOGASTRODUODENOSCOPY  07/08/2015   Schatzki ring status post esophageal dilatation. Small hiatal hernia   FLEXIBLE SIGMOIDOSCOPY  1999   Dr Rayfield Citizen   KNEE SURGERY     STAPEDES SURGERY     VASECTOMY     WRIST SURGERY Left 07/2016   Carpal Tunnel    Current Medications: Current Meds  Medication Sig   apixaban (ELIQUIS) 5 MG TABS tablet TAKE 1 TABLET BY MOUTH TWICE A DAY   diltiazem (CARDIZEM) 30 MG tablet Take 1 tablet (30 mg total) by mouth every 8 (eight) hours as needed (for heart rate greater than 110 BPM).   famotidine (PEPCID) 20 MG tablet Take 20 mg by mouth every other day.   furosemide (LASIX) 20 MG tablet Take 1 tablet (20  mg total) by mouth daily. After 2 days take as needed for Wt gain of 3 lbs in one night   levothyroxine (SYNTHROID) 50 MCG tablet Take 50 mcg by mouth daily.   metoprolol succinate (TOPROL-XL) 25 MG 24 hr tablet TAKE 1 TABLET BY MOUTH EVERY DAY   Multiple Vitamins-Minerals (MACULAR HEALTH FORMULA PO) Take 1 tablet by mouth 2 (two) times daily.    omeprazole (PRILOSEC) 20 MG capsule Take 20 mg by mouth every other day.   tamsulosin (FLOMAX) 0.4 MG CAPS capsule Take 0.4 mg by mouth daily.   vitamin C (ASCORBIC  ACID) 500 MG tablet Take 1,000 mg by mouth daily.   [DISCONTINUED] omeprazole (PRILOSEC) 20 MG capsule TAKE 1 CAPSULE BY MOUTH EVERY DAY (Patient taking differently: Take 20 mg by mouth every other day.)   Current Facility-Administered Medications for the 02/15/23 encounter (Office Visit) with Flossie Dibble, NP  Medication   0.9 %  sodium chloride infusion     Allergies:   Patient has no known allergies.   Social History   Socioeconomic History   Marital status: Married    Spouse name: Not on file   Number of children: Not on file   Years of education: Not on file   Highest education level: Not on file  Occupational History   Not on file  Tobacco Use   Smoking status: Never   Smokeless tobacco: Never  Vaping Use   Vaping Use: Never used  Substance and Sexual Activity   Alcohol use: Not Currently   Drug use: Not Currently   Sexual activity: Not on file  Other Topics Concern   Not on file  Social History Narrative   Not on file   Social Determinants of Health   Financial Resource Strain: Not on file  Food Insecurity: Not on file  Transportation Needs: Not on file  Physical Activity: Not on file  Stress: Not on file  Social Connections: Not on file     Family History: The patient's family history includes Cancer in his brother and mother; Diabetes in his mother; Heart disease in his brother, father, and mother; Hypertension in his brother; Stroke in his mother. There is no history of Colon cancer, Rectal cancer, or Esophageal cancer.  ROS:   Please see the history of present illness.     All other systems reviewed and are negative.  EKGs/Labs/Other Studies Reviewed:    The following studies were reviewed today:  Echocardiogram 11/30/2016-EF 60 to 65%, mild dilatation of left atrium, mild to moderate MR, mild TR.  EKG:  EKG is  ordered today.  The ekg ordered today demonstrates normal sinus rhythm with left axis deviation, heart rate 72 bpm.  Consistent with  prior EKG tracings  Recent Labs: 12/29/2022: Hemoglobin 13.7; NT-Pro BNP 207; Platelets 266 01/05/2023: BUN 15; Creatinine, Ser 0.83; Potassium 4.9; Sodium 139  Recent Lipid Panel No results found for: "CHOL", "TRIG", "HDL", "CHOLHDL", "VLDL", "LDLCALC", "LDLDIRECT"   Risk Assessment/Calculations:    CHA2DS2-VASc Score = 3   This indicates a 3.2% annual risk of stroke. The patient's score is based upon: CHF History: 0 HTN History: 1 Diabetes History: 0 Stroke History: 0 Vascular Disease History: 0 Age Score: 2 Gender Score: 0               Physical Exam:    VS:  BP 138/70 (BP Location: Left Arm, Patient Position: Sitting, Cuff Size: Normal)   Pulse (!) 56   Ht 6' (1.829 m)  Wt 191 lb (86.6 kg)   SpO2 95%   BMI 25.90 kg/m     Wt Readings from Last 3 Encounters:  02/15/23 191 lb (86.6 kg)  12/29/22 189 lb 9.6 oz (86 kg)  04/08/22 189 lb (85.7 kg)     GEN: Appears younger than stated age, well nourished, well developed in no acute distress HEENT: Normal NECK: No JVD; No carotid bruits LYMPHATICS: No lymphadenopathy CARDIAC: RRR, no murmurs, rubs, gallops RESPIRATORY: Left lung is clear, right lower lobe with minimal adventitious breath sound ABDOMEN: Soft, non-tender, non-distended MUSCULOSKELETAL: Trace edema; left lower ankle in brace due to polio SKIN: Warm and dry NEUROLOGIC:  Alert and oriented x 3 PSYCHIATRIC:  Normal affect   ASSESSMENT:    1. PAF (paroxysmal atrial fibrillation) (HCC)   2. Long term current use of anticoagulant   3. Essential hypertension   4. DOE (dyspnea on exertion)   5. Chronic heart failure with preserved ejection fraction (HCC)     PLAN:    In order of problems listed above:  DOE/SOB-this is overall better for him than when he was last here in our office.  Repeat echo revealed EF 60 to 65%, moderate concentric LVH, grade 1 DD.  He has been taking Lasix as needed for weight count of 3 pounds, has only needed x 1 since he  was last evaluated.  Paroxysmal atrial fibrillation -no recurrence of A-fib since 2019.  He is in sinus rhythm today.  Continue Eliquis 5 mg twice a day, no indication for dose reduction.  Continue metoprolol 25 mg daily.  Recent CBC in April without signs of anemia, BMET in May showed stable kidney function.  He has as needed diltiazem for sustained heart rate greater than 110, however he has not needed this.  Hypertension-blood pressure is controlled today at 138/70, continue metoprolol 20 mg daily.   Disposition-return in 6 months       Medication Adjustments/Labs and Tests Ordered: Current medicines are reviewed at length with the patient today.  Concerns regarding medicines are outlined above.  No orders of the defined types were placed in this encounter.  No orders of the defined types were placed in this encounter.   Patient Instructions  Medication Instructions:  Your physician recommends that you continue on your current medications as directed. Please refer to the Current Medication list given to you today.  *If you need a refill on your cardiac medications before your next appointment, please call your pharmacy*   Lab Work: None ordered If you have labs (blood work) drawn today and your tests are completely normal, you will receive your results only by: MyChart Message (if you have MyChart) OR A paper copy in the mail If you have any lab test that is abnormal or we need to change your treatment, we will call you to review the results.   Testing/Procedures: None ordered   Follow-Up: At [facility], you and your health needs are our priority.  As part of our continuing mission to provide you with exceptional heart care, we have created designated Provider Care Teams.  These Care Teams include your primary Cardiologist (physician) and Advanced Practice Providers (APPs -  Physician Assistants and Nurse Practitioners) who all work together to provide you with the  care you need, when you need it.  We recommend signing up for the patient portal called "MyChart".  Sign up information is provided on this After Visit Summary.  MyChart is used to connect with patients for  Virtual Visits (Telemedicine).  Patients are able to view lab/test results, encounter notes, upcoming appointments, etc.  Non-urgent messages can be sent to your provider as well.   To learn more about what you can do with MyChart, go to [email or web address].    Your next appointment:   6 month(s)  The format for your next appointment:   In Person  Provider:   Norman Herrlich, MD or Wallis Bamberg, NP [facility])    Other Instructions none  Important Information About Sugar        Signed, Flossie Dibble, NP  02/15/2023 9:28 AM     [facility]

## 2023-02-15 ENCOUNTER — Ambulatory Visit: Payer: Medicare HMO | Attending: Cardiology | Admitting: Cardiology

## 2023-02-15 ENCOUNTER — Other Ambulatory Visit: Payer: Self-pay | Admitting: Cardiology

## 2023-02-15 ENCOUNTER — Encounter: Payer: Self-pay | Admitting: Cardiology

## 2023-02-15 VITALS — BP 138/70 | HR 56 | Ht 72.0 in | Wt 191.0 lb

## 2023-02-15 DIAGNOSIS — R0609 Other forms of dyspnea: Secondary | ICD-10-CM

## 2023-02-15 DIAGNOSIS — I480 Paroxysmal atrial fibrillation: Secondary | ICD-10-CM

## 2023-02-15 DIAGNOSIS — Z7901 Long term (current) use of anticoagulants: Secondary | ICD-10-CM | POA: Diagnosis not present

## 2023-02-15 DIAGNOSIS — I10 Essential (primary) hypertension: Secondary | ICD-10-CM

## 2023-02-15 DIAGNOSIS — I5032 Chronic diastolic (congestive) heart failure: Secondary | ICD-10-CM | POA: Diagnosis not present

## 2023-02-15 NOTE — Patient Instructions (Signed)
Medication Instructions:  Your physician recommends that you continue on your current medications as directed. Please refer to the Current Medication list given to you today.  *If you need a refill on your cardiac medications before your next appointment, please call your pharmacy*   Lab Work: None ordered If you have labs (blood work) drawn today and your tests are completely normal, you will receive your results only by: MyChart Message (if you have MyChart) OR A paper copy in the mail If you have any lab test that is abnormal or we need to change your treatment, we will call you to review the results.   Testing/Procedures: None ordered   Follow-Up: At [facility], you and your health needs are our priority.  As part of our continuing mission to provide you with exceptional heart care, we have created designated Provider Care Teams.  These Care Teams include your primary Cardiologist (physician) and Advanced Practice Providers (APPs -  Physician Assistants and Nurse Practitioners) who all work together to provide you with the care you need, when you need it.  We recommend signing up for the patient portal called "MyChart".  Sign up information is provided on this After Visit Summary.  MyChart is used to connect with patients for Virtual Visits (Telemedicine).  Patients are able to view lab/test results, encounter notes, upcoming appointments, etc.  Non-urgent messages can be sent to your provider as well.   To learn more about what you can do with MyChart, go to [email or web address].    Your next appointment:   6 month(s)  The format for your next appointment:   In Person  Provider:   Brian Munley, MD or Jennifer Woody, NP ([place])    Other Instructions none  Important Information About Sugar      

## 2023-02-15 NOTE — Telephone Encounter (Signed)
Prescription refill request for Eliquis received. Indication:afib Last office visit:6/24 Scr:0.83 Age: 87 Weight:86.6  kg  Prescription refilled

## 2023-02-18 ENCOUNTER — Other Ambulatory Visit: Payer: Self-pay | Admitting: Cardiology

## 2023-02-18 NOTE — Telephone Encounter (Signed)
Rx to pharmacy

## 2023-02-25 DIAGNOSIS — M170 Bilateral primary osteoarthritis of knee: Secondary | ICD-10-CM | POA: Diagnosis not present

## 2023-03-04 DIAGNOSIS — M170 Bilateral primary osteoarthritis of knee: Secondary | ICD-10-CM | POA: Diagnosis not present

## 2023-03-11 DIAGNOSIS — M170 Bilateral primary osteoarthritis of knee: Secondary | ICD-10-CM | POA: Diagnosis not present

## 2023-04-01 ENCOUNTER — Ambulatory Visit: Payer: Medicare HMO | Admitting: Podiatry

## 2023-04-01 DIAGNOSIS — B351 Tinea unguium: Secondary | ICD-10-CM | POA: Diagnosis not present

## 2023-04-01 DIAGNOSIS — L84 Corns and callosities: Secondary | ICD-10-CM | POA: Diagnosis not present

## 2023-04-01 DIAGNOSIS — I739 Peripheral vascular disease, unspecified: Secondary | ICD-10-CM | POA: Diagnosis not present

## 2023-04-01 NOTE — Progress Notes (Unsigned)
    Subjective:  Patient ID: Terry Villanueva, male    DOB: Jun 14, 1936,  MRN: [id number]  Terry Villanueva presents to clinic today for:  Chief Complaint  Patient presents with   Nail Problem    RFC  . Patient notes nails are thick and elongated, causing pain in shoe gear when ambulating.  He also has painful calluses submet 5 bilateral  PCP is Noni Saupe, MD.  No Known Allergies  Review of Systems: Negative except as noted in the HPI.  Objective:  There were no vitals filed for this visit.  Terry Villanueva is a pleasant 87 y.o. male in NAD. AAO x 3.  Vascular Examination: Patient has palpable DP pulse, absent PT pulse bilateral.  Delayed capillary refill bilateral toes.  Sparse digital hair bilateral.  Proximal to distal cooling WNL bilateral.    Dermatological Examination: Interspaces are clear with no open lesions noted bilateral.  Nails are 3-70mm thick, with yellowish/brown discoloration, subungual debris and distal onycholysis x10.  There is pain with compression of nails x10.  There are hyperkeratotic lesions noted submet 5 bilateral with pain on palpation.  Neurological Examination: Epicritic sensation intact b/l LE.   Patient qualifies for at-risk foot care because of PVD.  Assessment/Plan: 1. Dermatophytosis of nail   2. PVD (peripheral vascular disease) (HCC)   3. Callus of foot     Mycotic nails x10 were sharply debrided with sterile nail nippers and power debriding burr to decrease bulk and length.  Hyperkeratotic lesions x 2 were shaved with #312 blade.   Return in about 3 months (around 07/02/2023) for RFC.   Clerance Lav, DPM, FACFAS [facility]     [address] [place], [place] [address]                Office [phone]  Fax [phone]

## 2023-04-02 ENCOUNTER — Ambulatory Visit: Payer: Medicare HMO | Admitting: Podiatry

## 2023-04-03 ENCOUNTER — Other Ambulatory Visit: Payer: Self-pay | Admitting: Cardiology

## 2023-04-03 ENCOUNTER — Encounter: Payer: Self-pay | Admitting: Podiatry

## 2023-05-05 ENCOUNTER — Ambulatory Visit: Payer: Medicare HMO | Admitting: Podiatry

## 2023-05-20 DIAGNOSIS — L570 Actinic keratosis: Secondary | ICD-10-CM | POA: Diagnosis not present

## 2023-05-20 DIAGNOSIS — L814 Other melanin hyperpigmentation: Secondary | ICD-10-CM | POA: Diagnosis not present

## 2023-05-20 DIAGNOSIS — L821 Other seborrheic keratosis: Secondary | ICD-10-CM | POA: Diagnosis not present

## 2023-05-20 DIAGNOSIS — D225 Melanocytic nevi of trunk: Secondary | ICD-10-CM | POA: Diagnosis not present

## 2023-06-02 ENCOUNTER — Ambulatory Visit: Payer: Medicare HMO | Admitting: Podiatry

## 2023-06-03 ENCOUNTER — Ambulatory Visit: Payer: Medicare HMO | Admitting: Podiatry

## 2023-06-09 ENCOUNTER — Ambulatory Visit: Payer: Medicare HMO | Admitting: Podiatry

## 2023-06-09 DIAGNOSIS — L84 Corns and callosities: Secondary | ICD-10-CM | POA: Diagnosis not present

## 2023-06-09 DIAGNOSIS — I739 Peripheral vascular disease, unspecified: Secondary | ICD-10-CM

## 2023-06-09 DIAGNOSIS — B351 Tinea unguium: Secondary | ICD-10-CM | POA: Diagnosis not present

## 2023-06-09 NOTE — Progress Notes (Signed)
Subjective:  Patient ID: Terry Villanueva, male    DOB: 1936-01-25,  MRN: [id number]  Terry Villanueva presents to clinic today for:  Chief Complaint  Patient presents with   Nail Problem    Routine Foot Care-nail trim   . Patient notes nails are thick and elongated, causing pain in shoe gear when ambulating.  Has painful plantar calluses  PCP is Terry Saupe, MD.  Last seen 12/24/22  Past Medical History:  Diagnosis Date   Arthritis 02/21/2018   Carpal tunnel syndrome of left wrist 08/28/2014   Cervical radiculopathy 08/16/2014   Erectile dysfunction due to arterial insufficiency 07/24/2015   Esophageal dysphagia    Essential hypertension 06/07/2017   GERD (gastroesophageal reflux disease) 02/21/2018   Heart disease 07/17/2021   Hypertensive heart disease 06/07/2017   Hypothyroidism 02/21/2018   Impingement syndrome of left shoulder region 09/20/2018   Kidney stone 01/2022   Long term current use of anticoagulant 12/19/2016   PAF (paroxysmal atrial fibrillation) (HCC) 06/07/2017   Polio    1952   Prostate nodule 07/24/2015   Sciatica     No Known Allergies  Objective:  There were no vitals filed for this visit.  Yaqoob Kazi is a pleasant 87 y.o. male in NAD. AAO x 3.  Vascular Examination: Patient has palpable DP pulse, absent PT pulse bilateral.  Delayed capillary refill bilateral toes.  Sparse digital hair bilateral.  Proximal to distal cooling WNL bilateral.    Dermatological Examination: Interspaces are clear with no open lesions noted bilateral.  Skin is shiny and atrophic bilateral.  Nails are 3-104mm thick, with yellowish/brown discoloration, subungual debris and distal onycholysis x10.  There is pain with compression of nails x10.  There are hyperkeratotic lesions noted submet 5 bilateral .  Patient qualifies for at-risk foot care because of PVD .  Assessment/Plan: 1. PVD (peripheral vascular disease) (HCC)   2. Dermatophytosis of nail   3. Callus of  foot     Mycotic nails x10 were sharply debrided with sterile nail nippers and power debriding burr to decrease bulk and length.  Hyperkeratotic lesions x2 were shaved with #312 blade.   Return in about 3 months (around 09/08/2023) for RFC.   Clerance Lav, DPM, FACFAS [facility]     [address] [place], [place] [address]                Office [phone]  Fax [phone]

## 2023-06-12 DIAGNOSIS — R748 Abnormal levels of other serum enzymes: Secondary | ICD-10-CM | POA: Diagnosis not present

## 2023-06-12 DIAGNOSIS — I480 Paroxysmal atrial fibrillation: Secondary | ICD-10-CM | POA: Diagnosis not present

## 2023-06-12 DIAGNOSIS — I444 Left anterior fascicular block: Secondary | ICD-10-CM | POA: Diagnosis not present

## 2023-06-12 DIAGNOSIS — R002 Palpitations: Secondary | ICD-10-CM | POA: Diagnosis not present

## 2023-06-12 DIAGNOSIS — R9431 Abnormal electrocardiogram [ECG] [EKG]: Secondary | ICD-10-CM | POA: Diagnosis not present

## 2023-06-12 DIAGNOSIS — R0602 Shortness of breath: Secondary | ICD-10-CM | POA: Diagnosis not present

## 2023-06-12 DIAGNOSIS — I10 Essential (primary) hypertension: Secondary | ICD-10-CM | POA: Diagnosis not present

## 2023-06-12 DIAGNOSIS — I510 Cardiac septal defect, acquired: Secondary | ICD-10-CM | POA: Diagnosis not present

## 2023-06-12 DIAGNOSIS — I4891 Unspecified atrial fibrillation: Secondary | ICD-10-CM | POA: Diagnosis not present

## 2023-06-13 DIAGNOSIS — I4891 Unspecified atrial fibrillation: Secondary | ICD-10-CM | POA: Diagnosis not present

## 2023-06-13 DIAGNOSIS — R748 Abnormal levels of other serum enzymes: Secondary | ICD-10-CM | POA: Diagnosis not present

## 2023-06-13 DIAGNOSIS — I480 Paroxysmal atrial fibrillation: Secondary | ICD-10-CM | POA: Diagnosis not present

## 2023-06-13 DIAGNOSIS — I10 Essential (primary) hypertension: Secondary | ICD-10-CM | POA: Diagnosis not present

## 2023-06-14 DIAGNOSIS — I10 Essential (primary) hypertension: Secondary | ICD-10-CM | POA: Diagnosis not present

## 2023-06-14 DIAGNOSIS — I5189 Other ill-defined heart diseases: Secondary | ICD-10-CM | POA: Diagnosis not present

## 2023-06-14 DIAGNOSIS — R9431 Abnormal electrocardiogram [ECG] [EKG]: Secondary | ICD-10-CM | POA: Diagnosis not present

## 2023-06-14 DIAGNOSIS — R748 Abnormal levels of other serum enzymes: Secondary | ICD-10-CM | POA: Diagnosis not present

## 2023-06-14 DIAGNOSIS — I340 Nonrheumatic mitral (valve) insufficiency: Secondary | ICD-10-CM | POA: Diagnosis not present

## 2023-06-14 DIAGNOSIS — I361 Nonrheumatic tricuspid (valve) insufficiency: Secondary | ICD-10-CM | POA: Diagnosis not present

## 2023-06-14 DIAGNOSIS — I480 Paroxysmal atrial fibrillation: Secondary | ICD-10-CM | POA: Diagnosis not present

## 2023-06-15 NOTE — Progress Notes (Unsigned)
Cardiology Office Note:    Date:  06/16/2023   ID:  Terry Villanueva, DOB [birth year], MRN [id number]  PCP:  Noni Saupe, MD  Cardiologist:  Norman Herrlich, MD    Referring MD: Noni Saupe, MD    ASSESSMENT:    1. PAF (paroxysmal atrial fibrillation) (HCC)   2. Elevated troponin level   3. Abnormal TSH   4. Long term current use of anticoagulant   5. Essential hypertension   6. Abnormal myocardial perfusion study    PLAN:    In order of problems listed above:  He had a recurrent episode generally self manages at home but this time the difference was he had symptoms suggesting angina did not have infarction and has conflicting noninvasive studies To resolve the issue of significant CAD cardiac CTA will be performed T3-T4 normal but TSH is mildly elevated he will need repeat studies and may require the institution of suppressant therapy in the future Will continue his long-term anticoagulation His hypertension is presently well-controlled continue his beta-blocker   Next appointment: 6 weeks   Medication Adjustments/Labs and Tests Ordered: Current medicines are reviewed at length with the patient today.  Concerns regarding medicines are outlined above.  Orders Placed This Encounter  Procedures   CT CORONARY MORPH W/CTA COR W/SCORE W/CA W/CM &/OR WO/CM   Basic Metabolic Panel (BMET)   EKG 12-Lead   Meds ordered this encounter  Medications   metoprolol tartrate (LOPRESSOR) 50 MG tablet    Sig: Take 1 tablet (50 mg total) by mouth once for 1 dose. Please take this medication 2 hours before CT.    Dispense:  1 tablet    Refill:  0     History of Present Illness:    Terry Villanueva is a 87 y.o. male with a hx of paroxysmal atrial fibrillation with chronic anticoagulation and hypertensive heart disease with heart failure last seen 04/08/2022.  He is admitted to [facility] 06/12/2023 and discharged 2 days later.  There is no discharge summary his discharge  diagnosis was atrial fibrillation elevated troponin.  On hospital he had a myocardial perfusion study performed described as showing fixed defect EF 53% inferolateral anteroseptal hypokinesia.  He had an echocardiogram performed showing a normal left ventricle no findings of infarction mild LVH EF 55 to 60% moderate left atrial enlargement mild mitral and tricuspid regurgitation.  I independently reviewed his EKG from admission showing atrial fibrillation controlled heart rate of 105 bpm.  Chest x-ray was normal.   Lab studies showed a flat low-level troponin not indicative of ACS 0.13 peak nadir 0.09 proBNP 659 not significantly elevated TSH elevated greater than 10 with a normal T3 and T4  Compliance with diet, lifestyle and medications: Yes  Unfortunately was not seen by our group during his hospitalization He has a history of paroxysmal atrial fibrillation self treats at home with rate control but this time the difference was he had a tightness in his throat. This prompted ED evaluation he did not have ACS or myocardial infarction And has conflicting noninvasive studies and normal echocardiogram and a perfusion study showing scattered fixed defects in the question of multiple infarctions. I think it is important to define if he has CAD he will undergo cardiac CTA he does not have typical angina and if he does not have significant CAD I think he would benefit from an antiarrhythmic drug flecainide or the presence of CAD consider sotalol or Multaq or amiodarone. Past Medical History:  Diagnosis  Cardiology Office Note:    Date:  06/16/2023   ID:  Terry Villanueva, DOB [birth year], MRN [id number]  PCP:  Noni Saupe, MD  Cardiologist:  Norman Herrlich, MD    Referring MD: Noni Saupe, MD    ASSESSMENT:    1. PAF (paroxysmal atrial fibrillation) (HCC)   2. Elevated troponin level   3. Abnormal TSH   4. Long term current use of anticoagulant   5. Essential hypertension   6. Abnormal myocardial perfusion study    PLAN:    In order of problems listed above:  He had a recurrent episode generally self manages at home but this time the difference was he had symptoms suggesting angina did not have infarction and has conflicting noninvasive studies To resolve the issue of significant CAD cardiac CTA will be performed T3-T4 normal but TSH is mildly elevated he will need repeat studies and may require the institution of suppressant therapy in the future Will continue his long-term anticoagulation His hypertension is presently well-controlled continue his beta-blocker   Next appointment: 6 weeks   Medication Adjustments/Labs and Tests Ordered: Current medicines are reviewed at length with the patient today.  Concerns regarding medicines are outlined above.  Orders Placed This Encounter  Procedures   CT CORONARY MORPH W/CTA COR W/SCORE W/CA W/CM &/OR WO/CM   Basic Metabolic Panel (BMET)   EKG 12-Lead   Meds ordered this encounter  Medications   metoprolol tartrate (LOPRESSOR) 50 MG tablet    Sig: Take 1 tablet (50 mg total) by mouth once for 1 dose. Please take this medication 2 hours before CT.    Dispense:  1 tablet    Refill:  0     History of Present Illness:    Terry Villanueva is a 87 y.o. male with a hx of paroxysmal atrial fibrillation with chronic anticoagulation and hypertensive heart disease with heart failure last seen 04/08/2022.  He is admitted to [facility] 06/12/2023 and discharged 2 days later.  There is no discharge summary his discharge  diagnosis was atrial fibrillation elevated troponin.  On hospital he had a myocardial perfusion study performed described as showing fixed defect EF 53% inferolateral anteroseptal hypokinesia.  He had an echocardiogram performed showing a normal left ventricle no findings of infarction mild LVH EF 55 to 60% moderate left atrial enlargement mild mitral and tricuspid regurgitation.  I independently reviewed his EKG from admission showing atrial fibrillation controlled heart rate of 105 bpm.  Chest x-ray was normal.   Lab studies showed a flat low-level troponin not indicative of ACS 0.13 peak nadir 0.09 proBNP 659 not significantly elevated TSH elevated greater than 10 with a normal T3 and T4  Compliance with diet, lifestyle and medications: Yes  Unfortunately was not seen by our group during his hospitalization He has a history of paroxysmal atrial fibrillation self treats at home with rate control but this time the difference was he had a tightness in his throat. This prompted ED evaluation he did not have ACS or myocardial infarction And has conflicting noninvasive studies and normal echocardiogram and a perfusion study showing scattered fixed defects in the question of multiple infarctions. I think it is important to define if he has CAD he will undergo cardiac CTA he does not have typical angina and if he does not have significant CAD I think he would benefit from an antiarrhythmic drug flecainide or the presence of CAD consider sotalol or Multaq or amiodarone. Past Medical History:  Diagnosis  Date   Arthritis 02/21/2018   Carpal tunnel syndrome of left wrist 08/28/2014   Cervical radiculopathy 08/16/2014   Erectile dysfunction due to arterial insufficiency 07/24/2015   Esophageal dysphagia    Essential hypertension 06/07/2017   GERD (gastroesophageal reflux disease) 02/21/2018   Heart disease 07/17/2021   Hypertensive heart disease 06/07/2017   Hypothyroidism 02/21/2018   Impingement  syndrome of left shoulder region 09/20/2018   Kidney stone 01/2022   Long term current use of anticoagulant 12/19/2016   PAF (paroxysmal atrial fibrillation) (HCC) 06/07/2017   Polio    1952   Prostate nodule 07/24/2015   Sciatica     Current Medications: Current Meds  Medication Sig   acetaminophen (TYLENOL) 650 MG CR tablet Take 650 mg by mouth every 8 (eight) hours as needed for pain.   Calcium Carbonate-Vit D-Min (CALCIUM 1200 PO) Take 1 tablet by mouth daily.   diltiazem (CARDIZEM) 30 MG tablet Take 1 tablet (30 mg total) by mouth every 8 (eight) hours as needed (for heart rate greater than 110 BPM).   ELIQUIS 5 MG TABS tablet TAKE 1 TABLET BY MOUTH TWICE A DAY   famotidine (PEPCID) 20 MG tablet Take 20 mg by mouth every other day.   furosemide (LASIX) 20 MG tablet Take 1 tablet (20 mg total) by mouth daily. After 2 days take as needed for Wt gain of 3 lbs in one night   levothyroxine (SYNTHROID) 50 MCG tablet Take 50 mcg by mouth daily.   metoprolol succinate (TOPROL-XL) 25 MG 24 hr tablet TAKE 1 TABLET BY MOUTH EVERY DAY   metoprolol tartrate (LOPRESSOR) 50 MG tablet Take 1 tablet (50 mg total) by mouth once for 1 dose. Please take this medication 2 hours before CT.   Multiple Vitamins-Minerals (MACULAR HEALTH FORMULA PO) Take 1 tablet by mouth 2 (two) times daily.    omeprazole (PRILOSEC) 20 MG capsule Take 20 mg by mouth every other day.   tamsulosin (FLOMAX) 0.4 MG CAPS capsule Take 0.4 mg by mouth daily.   vitamin C (ASCORBIC ACID) 500 MG tablet Take 1,000 mg by mouth daily.   Current Facility-Administered Medications for the 06/16/23 encounter (Office Visit) with Baldo Daub, MD  Medication   0.9 %  sodium chloride infusion      EKGs/Labs/Other Studies Reviewed:    The following studies were reviewed today:  Cardiac Studies & Procedures       ECHOCARDIOGRAM  ECHOCARDIOGRAM COMPLETE 01/12/2023  Narrative ECHOCARDIOGRAM REPORT    Patient Name:   UNO ESAU Date of Exam: 01/12/2023 Medical Rec #:  [id number]    Height:       72.0 in Accession #:    [id number]   Weight:       189.6 lb Date of Birth:  May 23, 1936    BSA:          2.083 m Patient Age:    87 years     BP:           120/60 mmHg Patient Gender: M            HR:           56 bpm. Exam Location:  [facility]  Procedure: 2D Echo, Cardiac Doppler, Color Doppler and Strain Analysis  Indications:    PAF (paroxysmal atrial fibrillation) (HCC) [I48.0 (ICD-10-CM)]; Long term current use of anticoagulant [Z79.01 (ICD-10-CM)]; Hypertensive heart disease without heart failure [I11.9 (ICD-10-CM)]; DOE (dyspnea on exertion) [R06.09 (ICD-10-CM)]  History:  Cardiology Office Note:    Date:  06/16/2023   ID:  Terry Villanueva, DOB [birth year], MRN [id number]  PCP:  Noni Saupe, MD  Cardiologist:  Norman Herrlich, MD    Referring MD: Noni Saupe, MD    ASSESSMENT:    1. PAF (paroxysmal atrial fibrillation) (HCC)   2. Elevated troponin level   3. Abnormal TSH   4. Long term current use of anticoagulant   5. Essential hypertension   6. Abnormal myocardial perfusion study    PLAN:    In order of problems listed above:  He had a recurrent episode generally self manages at home but this time the difference was he had symptoms suggesting angina did not have infarction and has conflicting noninvasive studies To resolve the issue of significant CAD cardiac CTA will be performed T3-T4 normal but TSH is mildly elevated he will need repeat studies and may require the institution of suppressant therapy in the future Will continue his long-term anticoagulation His hypertension is presently well-controlled continue his beta-blocker   Next appointment: 6 weeks   Medication Adjustments/Labs and Tests Ordered: Current medicines are reviewed at length with the patient today.  Concerns regarding medicines are outlined above.  Orders Placed This Encounter  Procedures   CT CORONARY MORPH W/CTA COR W/SCORE W/CA W/CM &/OR WO/CM   Basic Metabolic Panel (BMET)   EKG 12-Lead   Meds ordered this encounter  Medications   metoprolol tartrate (LOPRESSOR) 50 MG tablet    Sig: Take 1 tablet (50 mg total) by mouth once for 1 dose. Please take this medication 2 hours before CT.    Dispense:  1 tablet    Refill:  0     History of Present Illness:    Terry Villanueva is a 87 y.o. male with a hx of paroxysmal atrial fibrillation with chronic anticoagulation and hypertensive heart disease with heart failure last seen 04/08/2022.  He is admitted to [facility] 06/12/2023 and discharged 2 days later.  There is no discharge summary his discharge  diagnosis was atrial fibrillation elevated troponin.  On hospital he had a myocardial perfusion study performed described as showing fixed defect EF 53% inferolateral anteroseptal hypokinesia.  He had an echocardiogram performed showing a normal left ventricle no findings of infarction mild LVH EF 55 to 60% moderate left atrial enlargement mild mitral and tricuspid regurgitation.  I independently reviewed his EKG from admission showing atrial fibrillation controlled heart rate of 105 bpm.  Chest x-ray was normal.   Lab studies showed a flat low-level troponin not indicative of ACS 0.13 peak nadir 0.09 proBNP 659 not significantly elevated TSH elevated greater than 10 with a normal T3 and T4  Compliance with diet, lifestyle and medications: Yes  Unfortunately was not seen by our group during his hospitalization He has a history of paroxysmal atrial fibrillation self treats at home with rate control but this time the difference was he had a tightness in his throat. This prompted ED evaluation he did not have ACS or myocardial infarction And has conflicting noninvasive studies and normal echocardiogram and a perfusion study showing scattered fixed defects in the question of multiple infarctions. I think it is important to define if he has CAD he will undergo cardiac CTA he does not have typical angina and if he does not have significant CAD I think he would benefit from an antiarrhythmic drug flecainide or the presence of CAD consider sotalol or Multaq or amiodarone. Past Medical History:  Diagnosis

## 2023-06-16 ENCOUNTER — Encounter: Payer: Self-pay | Admitting: Cardiology

## 2023-06-16 ENCOUNTER — Ambulatory Visit: Payer: Medicare HMO | Attending: Cardiology | Admitting: Cardiology

## 2023-06-16 VITALS — BP 130/82 | HR 64 | Ht 72.0 in | Wt 189.4 lb

## 2023-06-16 DIAGNOSIS — I480 Paroxysmal atrial fibrillation: Secondary | ICD-10-CM

## 2023-06-16 DIAGNOSIS — R9439 Abnormal result of other cardiovascular function study: Secondary | ICD-10-CM | POA: Diagnosis not present

## 2023-06-16 DIAGNOSIS — R7989 Other specified abnormal findings of blood chemistry: Secondary | ICD-10-CM | POA: Diagnosis not present

## 2023-06-16 DIAGNOSIS — Z7901 Long term (current) use of anticoagulants: Secondary | ICD-10-CM

## 2023-06-16 DIAGNOSIS — I10 Essential (primary) hypertension: Secondary | ICD-10-CM | POA: Diagnosis not present

## 2023-06-16 MED ORDER — METOPROLOL TARTRATE 50 MG PO TABS: 50.0000 mg | ORAL_TABLET | Freq: Once | ORAL | 0 refills | Status: DC

## 2023-06-16 NOTE — Patient Instructions (Addendum)
Medication Instructions:  Your physician recommends that you continue on your current medications as directed. Please refer to the Current Medication list given to you today.  *If you need a refill on your cardiac medications before your next appointment, please call your pharmacy*   Lab Work: Your physician recommends that you return for lab work in:   Labs today: BMP  If you have labs (blood work) drawn today and your tests are completely normal, you will receive your results only by: MyChart Message (if you have MyChart) OR A paper copy in the mail If you have any lab test that is abnormal or we need to change your treatment, we will call you to review the results.   Testing/Procedures:   Your cardiac CT will be scheduled at one of the below locations:   [facility] [address] [place], [place] [address] [phone]  OR  [facility] [address] [place], [place] [address] [phone]  OR   [facility] [address] [place], [place] [address] [phone]  If scheduled at [facility], please arrive at the [facility] (Entrance C2) of [facility] 30 minutes prior to test start time. You can use the FREE valet parking offered at entrance C (encouraged to control the heart rate for the test)  Proceed to the [facility] Radiology Department (first floor) to check-in and test prep.  All radiology patients and guests should use entrance C2 at [facility], accessed from [facility], even though the hospital's physical address listed is [address]    If scheduled at [facility] or [facility], please arrive 15 mins early for check-in and test prep.  There is spacious parking and easy access to the radiology department from the [facility] entrance. Please  enter here and check-in with the desk attendant.   Please follow these instructions carefully (unless otherwise directed):  An IV will be required for this test and Nitroglycerin will be given.  Hold all erectile dysfunction medications at least 3 days (72 hrs) prior to test. (Ie viagra, cialis, sildenafil, tadalafil, etc)   On the Night Before the Test: Be sure to Drink plenty of water. Do not consume any caffeinated/decaffeinated beverages or chocolate 12 hours prior to your test. Do not take any antihistamines 12 hours prior to your test.  On the Day of the Test: Drink plenty of water until 1 hour prior to the test. Do not eat any food 1 hour prior to test. You may take your regular medications prior to the test.  Take metoprolol (Lopressor) two hours prior to test. If you take Furosemide please HOLD on the morning of the test.  After the Test: Drink plenty of water. After receiving IV contrast, you may experience a mild flushed feeling. This is normal. On occasion, you may experience a mild rash up to 24 hours after the test. This is not dangerous. If this occurs, you can take Benadryl 25 mg and increase your fluid intake. If you experience trouble breathing, this can be serious. If it is severe call 911 IMMEDIATELY. If it is mild, please call our office.  We will call to schedule your test 2-4 weeks out understanding that some insurance companies will need an authorization prior to the service being performed.   For more information and frequently asked questions, please visit our website : [email or web address]  For non-scheduling related questions, please contact the cardiac imaging nurse navigator should you have any questions/concerns: Cardiac Imaging Nurse Navigators Direct Office Dial: [phone]   For scheduling needs, including cancellations and rescheduling, please call [place], [phone].    Follow-Up: At [facility], you and your  health needs are our priority.  As part of our continuing mission to provide you with exceptional heart care, we have created designated Provider Care Teams.  These Care Teams include your primary Cardiologist (physician) and Advanced Practice Providers (APPs -  Physician Assistants and Nurse Practitioners) who all work together to provide you with the care you need, when you need it.  We recommend signing up for the patient portal called "MyChart".  Sign up information is provided on this After Visit Summary.  MyChart is used to connect with patients for Virtual Visits (Telemedicine).  Patients are able to view lab/test results, encounter notes, upcoming appointments, etc.  Non-urgent messages can be sent to your provider as well.   To learn more about what you can do with MyChart, go to [email or web address].    Your next appointment:   6 week(s)  Provider:   Norman Herrlich, MD    Other Instructions None

## 2023-06-17 LAB — BASIC METABOLIC PANEL
BUN/Creatinine Ratio: 22 (ref 10–24)
BUN: 17 mg/dL (ref 8–27)
CO2: 25 mmol/L (ref 20–29)
Calcium: 9.6 mg/dL (ref 8.6–10.2)
Chloride: 102 mmol/L (ref 96–106)
Creatinine, Ser: 0.78 mg/dL (ref 0.76–1.27)
Glucose: 100 mg/dL — ABNORMAL HIGH (ref 70–99)
Potassium: 4.4 mmol/L (ref 3.5–5.2)
Sodium: 139 mmol/L (ref 134–144)
eGFR: 87 mL/min/{1.73_m2} (ref >59)

## 2023-06-21 DIAGNOSIS — N401 Enlarged prostate with lower urinary tract symptoms: Secondary | ICD-10-CM | POA: Diagnosis not present

## 2023-06-21 DIAGNOSIS — Z125 Encounter for screening for malignant neoplasm of prostate: Secondary | ICD-10-CM | POA: Diagnosis not present

## 2023-06-21 DIAGNOSIS — K5909 Other constipation: Secondary | ICD-10-CM | POA: Diagnosis not present

## 2023-06-22 ENCOUNTER — Telehealth (HOSPITAL_COMMUNITY): Payer: Self-pay | Admitting: Emergency Medicine

## 2023-06-22 NOTE — Telephone Encounter (Signed)
Reaching out to patient to offer assistance regarding upcoming cardiac imaging study; pt verbalizes understanding of appt date/time, parking situation and where to check in, pre-test NPO status and medications ordered, and verified current allergies; name and call back number provided for further questions should they arise Terry Yom RN Navigator Cardiac Imaging [facility] [phone] office [phone] cell 

## 2023-06-23 ENCOUNTER — Telehealth: Payer: Self-pay

## 2023-06-23 NOTE — Telephone Encounter (Signed)
Patient came in wanting to ask some questions about a CT that's scheduled for tomorrow, if he could get a call back at [phone]

## 2023-06-23 NOTE — Telephone Encounter (Signed)
Patient is schedule for a CT scan tomorrow and his question was, with his HR under 50(taking today 48hr) should he still take his night dose and morning dose for his CT scan. Per Dr. Madireddi advise with his HR at 48 at the moment, please take your normal night dose of Metoprolol Succinate 25mg  daily and take then 1/2 of the Metoprolol Tartrate 50 prescribed for CT 2 hours prior. Instructions were given to the patient verbally and written and he verbalized understanding,

## 2023-06-24 ENCOUNTER — Ambulatory Visit (HOSPITAL_COMMUNITY)
Admission: RE | Admit: 2023-06-24 | Discharge: 2023-06-24 | Disposition: A | Discharge status: Home / Self Care | Payer: Medicare HMO | Source: Ambulatory Visit | Attending: Cardiology | Admitting: Cardiology

## 2023-06-24 DIAGNOSIS — I480 Paroxysmal atrial fibrillation: Secondary | ICD-10-CM | POA: Diagnosis present

## 2023-06-24 DIAGNOSIS — R7989 Other specified abnormal findings of blood chemistry: Secondary | ICD-10-CM | POA: Insufficient documentation

## 2023-06-24 DIAGNOSIS — R9439 Abnormal result of other cardiovascular function study: Secondary | ICD-10-CM | POA: Insufficient documentation

## 2023-06-24 DIAGNOSIS — I10 Essential (primary) hypertension: Secondary | ICD-10-CM | POA: Insufficient documentation

## 2023-06-24 DIAGNOSIS — Z7901 Long term (current) use of anticoagulants: Secondary | ICD-10-CM | POA: Diagnosis present

## 2023-06-24 DIAGNOSIS — I2510 Atherosclerotic heart disease of native coronary artery without angina pectoris: Secondary | ICD-10-CM | POA: Diagnosis not present

## 2023-06-24 MED ORDER — IOHEXOL 350 MG/ML SOLN
95.0000 mL | Freq: Once | INTRAVENOUS | Status: AC | PRN
  Administered 2023-06-24: 95 mL via INTRAVENOUS

## 2023-06-24 MED ORDER — NITROGLYCERIN 0.4 MG SL SUBL
SUBLINGUAL_TABLET | SUBLINGUAL | Status: AC
  Filled 2023-06-24: qty 2

## 2023-06-24 MED ORDER — NITROGLYCERIN 0.4 MG SL SUBL
0.8000 mg | SUBLINGUAL_TABLET | Freq: Once | SUBLINGUAL | Status: AC
  Administered 2023-06-24: 0.8 mg via SUBLINGUAL

## 2023-06-24 NOTE — Telephone Encounter (Signed)
Patient came into the office and his questions regarding his CT were answered. Patient had no further questions at this time.

## 2023-06-25 ENCOUNTER — Telehealth: Payer: Self-pay

## 2023-06-25 NOTE — Telephone Encounter (Signed)
-----   Message from [place] sent at 06/25/2023 12:59 PM EDT ----- Best described as mildly abnormal  Reducing blood flow in the coronary arteries  You should be taking a statin I would recommend rosuvastatin 10 mg daily

## 2023-06-25 NOTE — Telephone Encounter (Signed)
LM to return my call. 

## 2023-06-28 ENCOUNTER — Telehealth: Payer: Self-pay

## 2023-06-28 ENCOUNTER — Other Ambulatory Visit: Payer: Self-pay | Admitting: Cardiology

## 2023-06-28 MED ORDER — ROSUVASTATIN CALCIUM 10 MG PO TABS
10.0000 mg | ORAL_TABLET | Freq: Every day | ORAL | 2 refills | Status: DC
Start: 2023-06-28 — End: 2023-08-30

## 2023-06-28 NOTE — Telephone Encounter (Signed)
-----   Message from [facility] Abad Manard P sent at 06/25/2023  5:01 PM EDT -----  ----- Message ----- From: Baldo Daub, MD Sent: 06/25/2023  12:59 PM EDT To: Mickie Bail [facility]/Hp Triage  Best described as mildly abnormal  Reducing blood flow in the coronary arteries  You should be taking a statin I would recommend rosuvastatin 10 mg daily

## 2023-06-28 NOTE — Telephone Encounter (Signed)
Patient notified of results and recommendations and agreed with plan, medication sent.

## 2023-07-02 DIAGNOSIS — H353131 Nonexudative age-related macular degeneration, bilateral, early dry stage: Secondary | ICD-10-CM | POA: Diagnosis not present

## 2023-07-07 ENCOUNTER — Ambulatory Visit: Payer: Medicare HMO | Admitting: Podiatry

## 2023-07-07 ENCOUNTER — Encounter: Payer: Self-pay | Admitting: Emergency Medicine

## 2023-07-13 DIAGNOSIS — Z23 Encounter for immunization: Secondary | ICD-10-CM | POA: Diagnosis not present

## 2023-07-19 ENCOUNTER — Ambulatory Visit: Payer: Medicare HMO | Admitting: Gastroenterology

## 2023-07-19 ENCOUNTER — Encounter: Payer: Self-pay | Admitting: Gastroenterology

## 2023-07-19 ENCOUNTER — Other Ambulatory Visit (INDEPENDENT_AMBULATORY_CARE_PROVIDER_SITE_OTHER): Payer: Medicare HMO

## 2023-07-19 VITALS — BP 118/80 | HR 56 | Ht 69.0 in | Wt 195.0 lb

## 2023-07-19 DIAGNOSIS — S20212A Contusion of left front wall of thorax, initial encounter: Secondary | ICD-10-CM | POA: Diagnosis not present

## 2023-07-19 DIAGNOSIS — K219 Gastro-esophageal reflux disease without esophagitis: Secondary | ICD-10-CM | POA: Diagnosis not present

## 2023-07-19 DIAGNOSIS — R109 Unspecified abdominal pain: Secondary | ICD-10-CM

## 2023-07-19 DIAGNOSIS — K449 Diaphragmatic hernia without obstruction or gangrene: Secondary | ICD-10-CM | POA: Diagnosis not present

## 2023-07-19 DIAGNOSIS — K581 Irritable bowel syndrome with constipation: Secondary | ICD-10-CM | POA: Diagnosis not present

## 2023-07-19 DIAGNOSIS — W19XXXA Unspecified fall, initial encounter: Secondary | ICD-10-CM | POA: Diagnosis not present

## 2023-07-19 LAB — CBC WITH DIFFERENTIAL/PLATELET
Basophils Absolute: 0.1 10*3/uL (ref 0.0–0.1)
Basophils Relative: 0.6 % (ref 0.0–3.0)
Eosinophils Absolute: 0.2 10*3/uL (ref 0.0–0.7)
Eosinophils Relative: 2.3 % (ref 0.0–5.0)
HCT: 40.4 % (ref 39.0–52.0)
Hemoglobin: 13.4 g/dL (ref 13.0–17.0)
Lymphocytes Relative: 26 % (ref 12.0–46.0)
Lymphs Abs: 2.4 10*3/uL (ref 0.7–4.0)
MCHC: 33.1 g/dL (ref 30.0–36.0)
MCV: 96 fL (ref 78.0–100.0)
Monocytes Absolute: 0.9 10*3/uL (ref 0.1–1.0)
Monocytes Relative: 10.3 % (ref 3.0–12.0)
Neutro Abs: 5.6 10*3/uL (ref 1.4–7.7)
Neutrophils Relative %: 60.8 % (ref 43.0–77.0)
Platelets: 258 10*3/uL (ref 150.0–400.0)
RBC: 4.21 Mil/uL — ABNORMAL LOW (ref 4.22–5.81)
RDW: 12.3 % (ref 11.5–15.5)
WBC: 9.1 10*3/uL (ref 4.0–10.5)

## 2023-07-19 LAB — COMPREHENSIVE METABOLIC PANEL
ALT: 17 U/L (ref 0–53)
AST: 24 U/L (ref 0–37)
Albumin: 4 g/dL (ref 3.5–5.2)
Alkaline Phosphatase: 41 U/L (ref 39–117)
BUN: 13 mg/dL (ref 6–23)
CO2: 29 meq/L (ref 19–32)
Calcium: 9.1 mg/dL (ref 8.4–10.5)
Chloride: 105 meq/L (ref 96–112)
Creatinine, Ser: 0.76 mg/dL (ref 0.40–1.50)
GFR: 81.89 mL/min (ref >60.00)
Glucose, Bld: 100 mg/dL — ABNORMAL HIGH (ref 70–99)
Potassium: 4.4 meq/L (ref 3.5–5.1)
Sodium: 140 meq/L (ref 135–145)
Total Bilirubin: 0.5 mg/dL (ref 0.2–1.2)
Total Protein: 6.6 g/dL (ref 6.0–8.3)

## 2023-07-19 LAB — TSH: TSH: 8.11 u[IU]/mL — ABNORMAL HIGH (ref 0.35–5.50)

## 2023-07-19 NOTE — Progress Notes (Signed)
Chief Complaint: FU  Referring Provider:  Noni Saupe, MD      ASSESSMENT AND PLAN;   #1. GERD with HH. H/O eso dysphagia d/t Schatzki's ring s/p EGD with dil 02/2021 #2. IBS-C #3. Periumblical Abdo pain with supraumbilical hernia which is mildly tender  Plan:  Continue omeprazole 20 mg p.o. every other day Miralax 17g every day CBC, CMP, TSH CT AP with contrast (next week) Increase water intake. Call in 2 weeks.    HPI:    Terry Villanueva is a 87 y.o. male  With H/O A Fib on Eliquis, hypertension, hypothyroidism For follow-up visit  Has been taking omeprazole every other day.  Doing much better from reflux standpoint. No further dysphagia since dilation.  C/O periumbilical abdominal pain with bloating Was worst a month ago when he made the appointment Sometimes gets better but does get worse at times Not associated with constipation Not associated with nausea or vomiting. No jaundice dark urine or pale stools.  Has been having more constipation lately.  Associated pellet-like stools. No weight loss.   Wt Readings from Last 3 Encounters:  07/19/23 195 lb (88.5 kg)  06/16/23 189 lb 6.4 oz (85.9 kg)  02/15/23 191 lb (86.6 kg)    Past GI procedures: EGD 02/2021 - Mild Schatzki ring. Dilated. - Small hiatal hernia. - A few gastric polyps. (Resected and retrieved x 3)  Colonoscopy 02/2021 - Moderate left colonic diverticulosis. - Internal hemorrhoids. - The examined portion of the ileum was normal. - The examination was otherwise normal on direct and retroflexion views. - No specimens collected.  -EGD 11/2014 Schatzki's ring s/p esophageal dilatation, small hiatal hernia, negative CLO. -Colonoscopy 11/2010: Mild sigmoid div Past Medical History:  Diagnosis Date   Arthritis 02/21/2018   Carpal tunnel syndrome of left wrist 08/28/2014   Cervical radiculopathy 08/16/2014   Erectile dysfunction due to arterial insufficiency 07/24/2015   Esophageal  dysphagia    Essential hypertension 06/07/2017   GERD (gastroesophageal reflux disease) 02/21/2018   Heart disease 07/17/2021   Hypertensive heart disease 06/07/2017   Hypothyroidism 02/21/2018   Impingement syndrome of left shoulder region 09/20/2018   Kidney stone 01/2022   Long term current use of anticoagulant 12/19/2016   PAF (paroxysmal atrial fibrillation) (HCC) 06/07/2017   Polio    1952   Prostate nodule 07/24/2015   Sciatica     Past Surgical History:  Procedure Laterality Date   BACK SURGERY     COLONOSCOPY  12/08/2010   Mild sigmoid diverticulosis. Small internal hemorrhoids   ESOPHAGOGASTRODUODENOSCOPY  07/08/2015   Schatzki ring status post esophageal dilatation. Small hiatal hernia   FLEXIBLE SIGMOIDOSCOPY  1999   Dr Rayfield Citizen   KNEE SURGERY     STAPEDES SURGERY     VASECTOMY     WRIST SURGERY Left 07/2016   Carpal Tunnel    Family History  Problem Relation Age of Onset   Cancer Mother    Diabetes Mother    Heart disease Mother    Stroke Mother    Heart disease Father    Cancer Brother    Heart disease Brother    Hypertension Brother    Colon cancer Neg Hx    Rectal cancer Neg Hx    Esophageal cancer Neg Hx     Social History   Tobacco Use   Smoking status: Never   Smokeless tobacco: Never  Vaping Use   Vaping status: Never Used  Substance Use Topics   Alcohol use:  Not Currently   Drug use: Not Currently    Current Outpatient Medications  Medication Sig Dispense Refill   acetaminophen (TYLENOL) 650 MG CR tablet Take 650 mg by mouth every 8 (eight) hours as needed for pain.     Calcium Carbonate-Vit D-Min (CALCIUM 1200 PO) Take 1 tablet by mouth daily.     diltiazem (CARDIZEM) 30 MG tablet Take 1 tablet (30 mg total) by mouth every 8 (eight) hours as needed (for heart rate greater than 110 BPM). 270 tablet 2   ELIQUIS 5 MG TABS tablet TAKE 1 TABLET BY MOUTH TWICE A DAY 180 tablet 1   famotidine (PEPCID) 20 MG tablet Take 20 mg by mouth every  other day. Mon, Wed, Fri,     furosemide (LASIX) 20 MG tablet Take 1 tablet (20 mg total) by mouth daily. After 2 days take as needed for Wt gain of 3 lbs in one night 90 tablet 3   levothyroxine (SYNTHROID) 50 MCG tablet Take 50 mcg by mouth daily.     metoprolol succinate (TOPROL-XL) 25 MG 24 hr tablet TAKE 1 TABLET BY MOUTH EVERY DAY 90 tablet 2   Multiple Vitamins-Minerals (MACULAR HEALTH FORMULA PO) Take 1 tablet by mouth 2 (two) times daily.      omeprazole (PRILOSEC) 20 MG capsule Take 20 mg by mouth every other day. Sun, Tue, Thurs, Sat     rosuvastatin (CRESTOR) 10 MG tablet Take 1 tablet (10 mg total) by mouth daily. 30 tablet 2   tamsulosin (FLOMAX) 0.4 MG CAPS capsule Take 0.4 mg by mouth daily.     vitamin C (ASCORBIC ACID) 500 MG tablet Take 1,000 mg by mouth daily.     Current Facility-Administered Medications  Medication Dose Route Frequency Provider Last Rate Last Admin   0.9 %  sodium chloride infusion  500 mL Intravenous Once Lynann Bologna, MD        No Known Allergies  Review of Systems:  neg     Physical Exam:    BP 118/80 (BP Location: Left Arm, Patient Position: Sitting, Cuff Size: Normal)   Pulse (!) 56   Ht 5\' 9"  (1.753 m) Comment: height measured without shoes  Wt 195 lb (88.5 kg)   BMI 28.80 kg/m  Wt Readings from Last 3 Encounters:  07/19/23 195 lb (88.5 kg)  06/16/23 189 lb 6.4 oz (85.9 kg)  02/15/23 191 lb (86.6 kg)   Gen: awake, alert, NAD HEENT: anicteric, no pallor CV: RRR, no mrg Pulm: CTA b/l Abd: soft, NT/ND, +BS throughout, small supra-umblical hernia -minimally tender. Ext: no c/c/e Neuro: nonfocal    Edman Circle, MD 07/19/2023, 1:59 PM  Cc: Noni Saupe, MD

## 2023-07-19 NOTE — Patient Instructions (Addendum)
_______________________________________________________  If your blood pressure at your visit was 140/90 or greater, please contact your primary care physician to follow up on this.  _______________________________________________________  If you are age 85 or older, your body mass index should be between 23-30. Your Body mass index is 28.8 kg/m. If this is out of the aforementioned range listed, please consider follow up with your Primary Care Provider.  If you are age 43 or younger, your body mass index should be between 19-25. Your Body mass index is 28.8 kg/m. If this is out of the aformentioned range listed, please consider follow up with your Primary Care Provider.   ________________________________________________________  The [facility] providers would like to encourage you to use [facility] to communicate with providers for non-urgent requests or questions.  Due to long hold times on the telephone, sending your provider a message by [facility] may be a faster and more efficient way to get a response.  Please allow 48 business hours for a response.  Please remember that this is for non-urgent requests.  _______________________________________________________  Your provider has requested that you go to the basement level for lab work before leaving today. Press "B" on the elevator. The lab is located at the first door on the left as you exit the elevator.  Please call Dr. Donne Hazel nurse  in 2 weeks at [phone]  to let her now how you are doing.    Increase water intake to at least 32oz a day   Continue omeprazole 20mg  daily  Please purchase the following medications over the counter and take as directed: Miralax 17g  You have been scheduled for a CT scan of the abdomen and pelvis at [facility] [place], [place], [place] [address]).   You are scheduled on 07-29-2023 at 1230pm. You should arrive by 10:15am for your appointment time for registration. Please follow the  written instructions below on the day of your exam:  WARNING: IF YOU ARE ALLERGIC TO IODINE/X-RAY DYE, PLEASE NOTIFY RADIOLOGY IMMEDIATELY AT [phone]! YOU WILL BE GIVEN A 13 HOUR PREMEDICATION PREP.  1) Do not eat or drink anything after 830am (4 hours prior to your test) 2) You will be given 2 bottles of oral contrast to drink on site. The solution may taste better if refrigerated, but do NOT add ice or any other liquid to this solution. Shake well before drinking.    Drink 1 bottle of contrast @ 1030am (2 hours prior to your exam)  Drink 1 bottle of contrast @ 1130am (1 hour prior to your exam)  You may take any medications as prescribed with a small amount of water, if necessary. If you take any of the following medications: METFORMIN, GLUCOPHAGE, GLUCOVANCE, AVANDAMET, RIOMET, FORTAMET, ACTOPLUS MET, JANUMET, GLUMETZA or METAGLIP, you MAY be asked to HOLD this medication 48 hours AFTER the exam.  The purpose of you drinking the oral contrast is to aid in the visualization of your intestinal tract. The contrast solution may cause some diarrhea. Depending on your individual set of symptoms, you may also receive an intravenous injection of x-ray contrast/dye. Plan on being at [facility] for 30 minutes or longer, depending on the type of exam you are having performed.  This test typically takes 30-45 minutes to complete.  If you have any questions regarding your exam or if you need to reschedule, you may call the CT department at [phone] between the hours of 8:00 am and 5:00 pm, Monday-Friday.  ________________________________________________________________________

## 2023-07-27 NOTE — Progress Notes (Unsigned)
Cardiology Office Note:    Date:  07/27/2023   ID:  Terry Villanueva, DOB 21-Jan-1936, MRN [id number]  PCP:  Annamaria Helling, DO  Cardiologist:  Norman Herrlich, MD    Referring MD: Noni Saupe, MD    ASSESSMENT:    1. Mild CAD   2. Agatston coronary artery calcium score between 100 and 199   3. PAF (paroxysmal atrial fibrillation) (HCC)   4. Long term current use of anticoagulant   5. Essential hypertension   6. Dyslipidemia    PLAN:    In order of problems listed above:  ***   Next appointment: ***   Medication Adjustments/Labs and Tests Ordered: Current medicines are reviewed at length with the patient today.  Concerns regarding medicines are outlined above.  No orders of the defined types were placed in this encounter.  No orders of the defined types were placed in this encounter.    History of Present Illness:    Terry Villanueva is a 87 y.o. male with a hx of paroxysmal atrial fibrillation with chronic anticoagulation and hypertensive heart disease with heart failure last seen 06/16/2023.  He is admitted to [facility] in September had a myocardial perfusion study described as showing fixed defect with inferolateral hypokinesia the echocardiogram showed normal left ventricular function and the testing was done because of chest discomfort with rapid atrial fibrillation.  He subsequently underwent cardiac CTA reported 07/18/2023 with mild CAD proximal to mid left anterior descending coronary artery 25 to 49% a coronary calcium score of 146. Compliance with diet, lifestyle and medications: *** Past Medical History:  Diagnosis Date   Arthritis 02/21/2018   Carpal tunnel syndrome of left wrist 08/28/2014   Cervical radiculopathy 08/16/2014   Erectile dysfunction due to arterial insufficiency 07/24/2015   Esophageal dysphagia    Essential hypertension 06/07/2017   GERD (gastroesophageal reflux disease) 02/21/2018   Heart disease 07/17/2021   Hypertensive  heart disease 06/07/2017   Hypothyroidism 02/21/2018   Impingement syndrome of left shoulder region 09/20/2018   Inflammatory pain 11/05/2022   Kidney stone 01/2022   Long term current use of anticoagulant 12/19/2016   PAF (paroxysmal atrial fibrillation) (HCC) 06/07/2017   Pain in right hand 11/05/2022   Polio    1952   Prostate nodule 07/24/2015   Sciatica     Current Medications: Current Meds  Medication Sig   acetaminophen (TYLENOL) 650 MG CR tablet Take 650 mg by mouth every 8 (eight) hours as needed for pain.   Calcium Carbonate-Vit D-Min (CALCIUM 1200 PO) Take 1 tablet by mouth daily.   diltiazem (CARDIZEM) 30 MG tablet Take 1 tablet (30 mg total) by mouth every 8 (eight) hours as needed (for heart rate greater than 110 BPM).   ELIQUIS 5 MG TABS tablet TAKE 1 TABLET BY MOUTH TWICE A DAY (Patient taking differently: Take 5 mg by mouth. TAKE 1 TABLET BY MOUTH TWICE A DAY)   famotidine (PEPCID) 20 MG tablet Take 20 mg by mouth every other day. Mon, Wed, Fri,   furosemide (LASIX) 20 MG tablet Take 1 tablet (20 mg total) by mouth daily. After 2 days take as needed for Wt gain of 3 lbs in one night   levothyroxine (SYNTHROID) 50 MCG tablet Take 50 mcg by mouth daily.   metoprolol succinate (TOPROL-XL) 25 MG 24 hr tablet TAKE 1 TABLET BY MOUTH EVERY DAY (Patient taking differently: Take 25 mg by mouth daily.)   Multiple Vitamins-Minerals (MACULAR HEALTH FORMULA PO) Take 1 tablet  by mouth 2 (two) times daily.    omeprazole (PRILOSEC) 20 MG capsule Take 20 mg by mouth every other day. Sun, Tue, Thurs, Sat   rosuvastatin (CRESTOR) 10 MG tablet Take 1 tablet (10 mg total) by mouth daily.   tamsulosin (FLOMAX) 0.4 MG CAPS capsule Take 0.4 mg by mouth daily.   vitamin C (ASCORBIC ACID) 500 MG tablet Take 1,000 mg by mouth daily.      EKGs/Labs/Other Studies Reviewed:    The following studies were reviewed today:  Cardiac Studies & Procedures     STRESS TESTS  MYOCARDIAL PERFUSION  IMAGING 06/14/2023   ECHOCARDIOGRAM  ECHOCARDIOGRAM COMPLETE 01/12/2023  Narrative ECHOCARDIOGRAM REPORT    Patient Name:   Terry Villanueva Date of Exam: 01/12/2023 Medical Rec #:  [id number]    Height:       72.0 in Accession #:    [id number]   Weight:       189.6 lb Date of Birth:  04-11-1936    BSA:          2.083 m Patient Age:    87 years     BP:           120/60 mmHg Patient Gender: M            HR:           56 bpm. Exam Location:  [facility]  Procedure: 2D Echo, Cardiac Doppler, Color Doppler and Strain Analysis  Indications:    PAF (paroxysmal atrial fibrillation) (HCC) [I48.0 (ICD-10-CM)]; Long term current use of anticoagulant [Z79.01 (ICD-10-CM)]; Hypertensive heart disease without heart failure [I11.9 (ICD-10-CM)]; DOE (dyspnea on exertion) [R06.09 (ICD-10-CM)]  History:        Patient has no prior history of Echocardiogram examinations. Risk Factors:Hypertension.  Sonographer:    Louie Boston RDCS Referring Phys: [phone] Darden Dates WOODY  IMPRESSIONS   1. Incidental finding of LV false chordae. Left ventricular ejection fraction, by estimation, is 60 to 65%. The left ventricle has normal function. The left ventricle has no regional wall motion abnormalities. There is moderate concentric left ventricular hypertrophy. Left ventricular diastolic parameters are consistent with Grade I diastolic dysfunction (impaired relaxation). 2. Right ventricular systolic function is normal. The right ventricular size is normal. There is normal pulmonary artery systolic pressure. 3. Left atrial size was mildly dilated. 4. The mitral valve is normal in structure. Mild mitral valve regurgitation. No evidence of mitral stenosis. 5. The aortic valve is tricuspid. Aortic valve regurgitation is not visualized. Aortic valve sclerosis is present, with no evidence of aortic valve stenosis. 6. Aortic Normal DTA. 7. The inferior vena cava is normal in size with greater than 50% respiratory variability,  suggesting right atrial pressure of 3 mmHg.  FINDINGS Left Ventricle: Incidental finding of LV false chordae. Left ventricular ejection fraction, by estimation, is 60 to 65%. The left ventricle has normal function. The left ventricle has no regional wall motion abnormalities. The left ventricular internal cavity size was normal in size. There is moderate concentric left ventricular hypertrophy. Left ventricular diastolic parameters are consistent with Grade I diastolic dysfunction (impaired relaxation). Normal left ventricular filling pressure.  Right Ventricle: The right ventricular size is normal. No increase in right ventricular wall thickness. Right ventricular systolic function is normal. There is normal pulmonary artery systolic pressure. The tricuspid regurgitant velocity is 2.22 m/s, and with an assumed right atrial pressure of 3 mmHg, the estimated right ventricular systolic pressure is 22.7 mmHg.  Left Atrium: Left atrial size was mildly  dilated.  Right Atrium: Right atrial size was normal in size.  Pericardium: There is no evidence of pericardial effusion.  Mitral Valve: The mitral valve is normal in structure. Mild mitral valve regurgitation. No evidence of mitral valve stenosis.  Tricuspid Valve: The tricuspid valve is normal in structure. Tricuspid valve regurgitation is mild . No evidence of tricuspid stenosis.  Aortic Valve: The aortic valve is tricuspid. Aortic valve regurgitation is not visualized. Aortic valve sclerosis is present, with no evidence of aortic valve stenosis.  Pulmonic Valve: The pulmonic valve was normal in structure. Pulmonic valve regurgitation is mild. No evidence of pulmonic stenosis.  Aorta: The aortic arch was not well visualized, the aortic root and ascending aorta are structurally normal, with no evidence of dilitation and Normal DTA.  Venous: The inferior vena cava is normal in size with greater than 50% respiratory variability, suggesting right  atrial pressure of 3 mmHg.  IAS/Shunts: No atrial level shunt detected by color flow Doppler.   LEFT VENTRICLE PLAX 2D LVIDd:         4.50 cm   Diastology LVIDs:         3.40 cm   LV e' medial:    4.79 cm/s LV PW:         1.40 cm   LV E/e' medial:  13.7 LV IVS:        1.50 cm   LV e' lateral:   7.62 cm/s LVOT diam:     2.20 cm   LV E/e' lateral: 8.6 LV SV:         73 LV SV Index:   35        2D Longitudinal Strain LVOT Area:     3.80 cm  2D Strain GLS Avg:     -15.1 %   RIGHT VENTRICLE             IVC RV S prime:     15.20 cm/s  IVC diam: 1.80 cm TAPSE (M-mode): 2.2 cm  LEFT ATRIUM             Index        RIGHT ATRIUM           Index LA diam:        3.80 cm 1.82 cm/m   RA Area:     13.30 cm LA Vol (A2C):   86.5 ml 41.53 ml/m  RA Volume:   33.50 ml  16.08 ml/m LA Vol (A4C):   62.5 ml 30.01 ml/m LA Biplane Vol: 74.5 ml 35.77 ml/m AORTIC VALVE LVOT Vmax:   84.90 cm/s LVOT Vmean:  54.250 cm/s LVOT VTI:    0.193 m  AORTA Ao Root diam: 3.70 cm Ao Asc diam:  3.60 cm Ao Desc diam: 2.30 cm  MV E velocity: 65.50 cm/s  TRICUSPID VALVE MV A velocity: 71.80 cm/s  TR Peak grad:   19.7 mmHg MV E/A ratio:  0.91        TR Vmax:        222.00 cm/s  SHUNTS Systemic VTI:  0.19 m Systemic Diam: 2.20 cm  Norman Herrlich MD Electronically signed by Norman Herrlich MD Signature Date/Time: 01/12/2023/5:08:44 PM    Final     CT SCANS  CT CORONARY MORPH W/CTA COR W/SCORE 06/24/2023  Addendum 07/18/2023  9:22 AM ADDENDUM REPORT: 07/18/2023 09:20  EXAM: OVER-READ INTERPRETATION  CT CHEST  The following report is an over-read performed by radiologist Dr. Nadara Eaton [facility] on 07/18/2023. This over-read  does not include interpretation of cardiac or coronary anatomy or pathology. The coronary CTA interpretation by the cardiologist is attached.  COMPARISON:  CT abdomen 02/14/2022  FINDINGS: Visualized mediastinal structures are normal. Images of the  upper abdomen are unremarkable. Focal subpleural densities in the anteromedial right middle lobe are most compatible with either atelectasis or scarring. Remainder of the visualized lungs are clear without airspace disease or consolidation. No large pleural effusions. No acute bone abnormality.  IMPRESSION: No acute extracardiac findings.   Electronically Signed By: Richarda Overlie M.D. On: 07/18/2023 09:20  Narrative HISTORY: Atrial fibrillation/flutter (AF)  EXAM: Cardiac/Coronary  CT  TECHNIQUE: The patient was scanned on a [employer].  PROTOCOL: A 120 kV prospective scan was triggered in the descending thoracic aorta at 111 HU's. Axial non-contrast 3 mm slices were carried out through the heart. The data set was analyzed on a dedicated work station and scored using the Agatston method. Gantry rotation speed was 250 msecs and collimation was .6 mm. Beta blockade and 0.8 mg of sl NTG was given. The 3D data set was reconstructed in 5% intervals of the 35-75 % of the R-R cycle. Systolic and diastolic phases were analyzed on a dedicated work station using MPR, MIP and VRT modes. The patient received contrast: 95mL OMNIPAQUE IOHEXOL 350 MG/ML SOLN.  FINDINGS: Image quality: Good  Noise artifact is: Limited  Coronary calcium score is 146  Coronary arteries: Normal coronary origins.  Right dominance.  Right Coronary Artery: Minimal plaque proximal RCA, <25% stenosis.  Left Main Coronary Artery: Short left main, no detectable plaque or stenosis.  Left Anterior Descending Coronary Artery: Mild plaque proximal and mid LAD, 25-49% stenosis.  Left Circumflex Artery: Minimal plaque mid OM2, <25% stenosis.  Aorta: Normal size, 35 mm at the mid ascending aorta (level of the PA bifurcation) measured double oblique.  Aortic Valve: No calcifications.  Other findings:  Normal pulmonary vein drainage into the left atrium.  Normal left atrial appendage without  thrombus.  Normal size of the pulmonary artery.  Left ventricular hypertrophy.  Please see separate report from [facility] for non-cardiac findings.  IMPRESSION: 1. Mild CAD in the proximal and mid LAD, 25-49% stenosis, CADRADS 2.  2. Total plaque volume 250 mm3 which is 8th percentile for age- and sex-matched controls (calcified plaque 29 mm3; non-calcified plaque 221 mm3). TPV is severe.  3. Coronary calcium score of 146.  4. Normal coronary origins with right dominance.  RECOMMENDATIONS: CAD-RADS 2. Mild non-obstructive CAD (25-49%). Consider non-atherosclerotic causes of chest pain. Consider preventive therapy and risk factor modification.  Electronically Signed: By: Weston Brass M.D. On: 06/25/2023 09:24              Recent Labs: 12/29/2022: NT-Pro BNP 207 07/19/2023: ALT 17; BUN 13; Creatinine, Ser 0.76; Hemoglobin 13.4; Platelets 258.0; Potassium 4.4; Sodium 140; TSH 8.11  Recent Lipid Panel No results found for: "CHOL", "TRIG", "HDL", "CHOLHDL", "VLDL", "LDLCALC", "LDLDIRECT"  Physical Exam:    VS:  There were no vitals taken for this visit.    Wt Readings from Last 3 Encounters:  07/19/23 195 lb (88.5 kg)  06/16/23 189 lb 6.4 oz (85.9 kg)  02/15/23 191 lb (86.6 kg)     GEN: *** Well nourished, well developed in no acute distress HEENT: Normal NECK: No JVD; No carotid bruits LYMPHATICS: No lymphadenopathy CARDIAC: ***RRR, no murmurs, rubs, gallops RESPIRATORY:  Clear to auscultation without rales, wheezing or rhonchi  ABDOMEN: Soft, non-tender, non-distended MUSCULOSKELETAL:  No  edema; No deformity  SKIN: Warm and dry NEUROLOGIC:  Alert and oriented x 3 PSYCHIATRIC:  Normal affect    Signed, Norman Herrlich, MD  07/27/2023 4:47 PM    [facility]

## 2023-07-28 ENCOUNTER — Encounter: Payer: Self-pay | Admitting: Cardiology

## 2023-07-28 ENCOUNTER — Ambulatory Visit: Payer: Medicare HMO | Attending: Cardiology | Admitting: Cardiology

## 2023-07-28 VITALS — BP 142/74 | HR 62 | Ht 72.0 in | Wt 191.0 lb

## 2023-07-28 DIAGNOSIS — Z7901 Long term (current) use of anticoagulants: Secondary | ICD-10-CM | POA: Diagnosis not present

## 2023-07-28 DIAGNOSIS — E785 Hyperlipidemia, unspecified: Secondary | ICD-10-CM | POA: Diagnosis not present

## 2023-07-28 DIAGNOSIS — R931 Abnormal findings on diagnostic imaging of heart and coronary circulation: Secondary | ICD-10-CM | POA: Diagnosis not present

## 2023-07-28 DIAGNOSIS — I480 Paroxysmal atrial fibrillation: Secondary | ICD-10-CM

## 2023-07-28 DIAGNOSIS — I10 Essential (primary) hypertension: Secondary | ICD-10-CM

## 2023-07-28 DIAGNOSIS — I2510 Atherosclerotic heart disease of native coronary artery without angina pectoris: Secondary | ICD-10-CM | POA: Diagnosis not present

## 2023-07-28 NOTE — Patient Instructions (Signed)

## 2023-07-29 ENCOUNTER — Ambulatory Visit (HOSPITAL_COMMUNITY)
Admission: RE | Admit: 2023-07-29 | Discharge: 2023-07-29 | Disposition: A | Discharge status: Home / Self Care | Payer: Medicare HMO | Source: Ambulatory Visit | Attending: Gastroenterology | Admitting: Gastroenterology

## 2023-07-29 DIAGNOSIS — K219 Gastro-esophageal reflux disease without esophagitis: Secondary | ICD-10-CM | POA: Diagnosis not present

## 2023-07-29 DIAGNOSIS — R109 Unspecified abdominal pain: Secondary | ICD-10-CM | POA: Diagnosis not present

## 2023-07-29 DIAGNOSIS — K581 Irritable bowel syndrome with constipation: Secondary | ICD-10-CM | POA: Diagnosis not present

## 2023-07-29 DIAGNOSIS — K449 Diaphragmatic hernia without obstruction or gangrene: Secondary | ICD-10-CM | POA: Insufficient documentation

## 2023-07-29 MED ORDER — IOHEXOL 300 MG/ML  SOLN
100.0000 mL | Freq: Once | INTRAMUSCULAR | Status: AC | PRN
  Administered 2023-07-29: 100 mL via INTRAVENOUS

## 2023-07-29 MED ORDER — IOHEXOL 300 MG/ML  SOLN
30.0000 mL | Freq: Once | INTRAMUSCULAR | Status: AC | PRN
  Administered 2023-07-29: 30 mL via ORAL

## 2023-08-02 ENCOUNTER — Telehealth: Payer: Self-pay

## 2023-08-02 NOTE — Telephone Encounter (Signed)
Pt called in to notify Dr. Chales Abrahams that after two weeks of taking the Miralax that he is doing a lot better and that he feels that he is cleaned out and has no more constipation.

## 2023-08-02 NOTE — Telephone Encounter (Signed)
Please continue taking MiraLAX once a day now or Metamucil every day with 8 ounces of water RG

## 2023-08-03 NOTE — Telephone Encounter (Signed)
Pt made aware of Dr. Lyndel Safe recommendations: Pt verbalized understanding with all questions answered.

## 2023-08-18 ENCOUNTER — Ambulatory Visit: Payer: Medicare HMO | Admitting: Podiatry

## 2023-08-18 DIAGNOSIS — M79675 Pain in left toe(s): Secondary | ICD-10-CM

## 2023-08-18 DIAGNOSIS — B351 Tinea unguium: Secondary | ICD-10-CM | POA: Diagnosis not present

## 2023-08-18 DIAGNOSIS — L84 Corns and callosities: Secondary | ICD-10-CM

## 2023-08-18 DIAGNOSIS — M79674 Pain in right toe(s): Secondary | ICD-10-CM

## 2023-08-18 DIAGNOSIS — I739 Peripheral vascular disease, unspecified: Secondary | ICD-10-CM | POA: Diagnosis not present

## 2023-08-18 NOTE — Progress Notes (Signed)
       Subjective:  Patient ID: Terry Villanueva, male    DOB: 14-Sep-1936,  MRN: [id number]  Terry Villanueva presents to clinic today for:  Chief Complaint  Patient presents with   RFC    RFC Blood Thinner  . Patient notes nails are thick and elongated, causing pain in shoe gear when ambulating.  Patient has painful calluses bilateral submet 5  PCP is Dr. Jeanie Sewer.  Last seen 12/24/22.  Past Medical History:  Diagnosis Date   Arthritis 02/21/2018   Carpal tunnel syndrome of left wrist 08/28/2014   Cervical radiculopathy 08/16/2014   Erectile dysfunction due to arterial insufficiency 07/24/2015   Esophageal dysphagia    Essential hypertension 06/07/2017   GERD (gastroesophageal reflux disease) 02/21/2018   Heart disease 07/17/2021   Hypertensive heart disease 06/07/2017   Hypothyroidism 02/21/2018   Impingement syndrome of left shoulder region 09/20/2018   Inflammatory pain 11/05/2022   Kidney stone 01/2022   Long term current use of anticoagulant 12/19/2016   PAF (paroxysmal atrial fibrillation) (HCC) 06/07/2017   Pain in right hand 11/05/2022   Polio    1952   Prostate nodule 07/24/2015   Sciatica     No Known Allergies  Objective:  Terry Villanueva is a pleasant 87 y.o. male in NAD. AAO x 3.  Vascular Examination: Patient has palpable DP pulse, absent PT pulse bilateral.  Delayed capillary refill bilateral toes.  Sparse digital hair bilateral.  Proximal to distal cooling WNL bilateral.    Dermatological Examination: Interspaces are clear with no open lesions noted bilateral.  Skin is shiny and atrophic bilateral.  Nails are 3-37mm thick, with yellowish/brown discoloration, subungual debris and distal onycholysis x10.  There is pain with compression of nails x10.  There are hyperkeratotic lesions noted bilateral submet 5 .  Patient qualifies for at-risk foot care because of PVD .  Assessment/Plan: 1. Pain due to onychomycosis of toenails of both feet   2. Callus of foot   3.  PVD (peripheral vascular disease) (HCC)    Mycotic nails x10 were sharply debrided with sterile nail nippers and power debriding burr to decrease bulk and length.  Hyperkeratotic lesions x2 were shaved with #312 blade.  Return in about 3 months (around 11/16/2023) for RFC.   Clerance Lav, DPM, FACFAS [facility]     [address] [place], [place] [address]                Office [phone]  Fax [phone]

## 2023-08-19 DIAGNOSIS — M170 Bilateral primary osteoarthritis of knee: Secondary | ICD-10-CM | POA: Diagnosis not present

## 2023-08-22 ENCOUNTER — Other Ambulatory Visit: Payer: Self-pay | Admitting: Gastroenterology

## 2023-08-29 ENCOUNTER — Other Ambulatory Visit: Payer: Self-pay | Admitting: Cardiology

## 2023-08-29 DIAGNOSIS — I480 Paroxysmal atrial fibrillation: Secondary | ICD-10-CM

## 2023-08-31 NOTE — Telephone Encounter (Signed)
Prescription refill request for Eliquis received. Indication:afib Last office visit:11/24 Scr:0.76  11/24 Age: 87 Weight:86.6  kg  Prescription refilled

## 2023-09-02 DIAGNOSIS — R69 Illness, unspecified: Secondary | ICD-10-CM | POA: Diagnosis not present

## 2023-10-21 ENCOUNTER — Ambulatory Visit: Payer: Medicare HMO | Admitting: Podiatry

## 2023-10-21 DIAGNOSIS — L84 Corns and callosities: Secondary | ICD-10-CM | POA: Diagnosis not present

## 2023-10-21 DIAGNOSIS — B351 Tinea unguium: Secondary | ICD-10-CM

## 2023-10-21 DIAGNOSIS — M79674 Pain in right toe(s): Secondary | ICD-10-CM | POA: Diagnosis not present

## 2023-10-21 DIAGNOSIS — M79675 Pain in left toe(s): Secondary | ICD-10-CM | POA: Diagnosis not present

## 2023-10-21 DIAGNOSIS — I739 Peripheral vascular disease, unspecified: Secondary | ICD-10-CM

## 2023-10-21 NOTE — Progress Notes (Signed)
       Subjective:  Patient ID: Terry Villanueva, male    DOB: 09-Dec-1935,  MRN: [id number]  Terry Villanueva presents to clinic today for:  Chief Complaint  Patient presents with   RFC    RFC with callouses, Not diabetic, takes elliquis.   Patient notes nails are thick and elongated, causing pain in shoe gear when ambulating.  He has bilateral submet 5 calluses and a new callus on lateral left ankle from his new brace.  PCP is Nabors, Dene BROCKS, DO.  Last seen around 09/03/23.  Past Medical History:  Diagnosis Date   Arthritis 02/21/2018   Carpal tunnel syndrome of left wrist 08/28/2014   Cervical radiculopathy 08/16/2014   Erectile dysfunction due to arterial insufficiency 07/24/2015   Esophageal dysphagia    Essential hypertension 06/07/2017   GERD (gastroesophageal reflux disease) 02/21/2018   Heart disease 07/17/2021   Hypertensive heart disease 06/07/2017   Hypothyroidism 02/21/2018   Impingement syndrome of left shoulder region 09/20/2018   Inflammatory pain 11/05/2022   Kidney stone 01/2022   Long term current use of anticoagulant 12/19/2016   PAF (paroxysmal atrial fibrillation) (HCC) 06/07/2017   Pain in right hand 11/05/2022   Polio    1952   Prostate nodule 07/24/2015   Sciatica     No Known Allergies  Objective:  Terry Villanueva is a pleasant 88 y.o. male in NAD. AAO x 3.  Vascular Examination: Patient has palpable DP pulse, absent PT pulse bilateral.  Delayed capillary refill bilateral toes.  Sparse digital hair bilateral.  Proximal to distal cooling WNL bilateral.    Dermatological Examination: Interspaces are clear with no open lesions noted bilateral.  Skin is shiny and atrophic bilateral.  Nails are 3-38mm thick, with yellowish/brown discoloration, subungual debris and distal onycholysis x10.  There is pain with compression of nails x10.  There are hyperkeratotic lesions noted bilateral submet 1 and lateral malleolus left .  Patient qualifies for at-risk foot  care because of PVD .  Assessment/Plan: 1. Pain due to onychomycosis of toenails of both feet   2. Callus of foot   3. PVD (peripheral vascular disease) (HCC)    Mycotic nails x10 were sharply debrided with sterile nail nippers and power debriding burr to decrease bulk and length.  Hyperkeratotic lesions x3 were shaved with #312 blade.   Return in about 3 months (around 01/18/2024) for RFC (with his son, too).   Terry Villanueva, DPM, FACFAS [facility]     [address] [place], [place] [address]                Office [phone]  Fax [phone]

## 2023-11-11 DIAGNOSIS — M170 Bilateral primary osteoarthritis of knee: Secondary | ICD-10-CM | POA: Diagnosis not present

## 2023-11-18 DIAGNOSIS — M170 Bilateral primary osteoarthritis of knee: Secondary | ICD-10-CM | POA: Diagnosis not present

## 2023-11-18 DIAGNOSIS — D492 Neoplasm of unspecified behavior of bone, soft tissue, and skin: Secondary | ICD-10-CM | POA: Diagnosis not present

## 2023-11-18 DIAGNOSIS — D044 Carcinoma in situ of skin of scalp and neck: Secondary | ICD-10-CM | POA: Diagnosis not present

## 2023-11-18 DIAGNOSIS — L570 Actinic keratosis: Secondary | ICD-10-CM | POA: Diagnosis not present

## 2023-11-23 DIAGNOSIS — H9191 Unspecified hearing loss, right ear: Secondary | ICD-10-CM | POA: Diagnosis not present

## 2023-11-25 ENCOUNTER — Ambulatory Visit: Payer: Medicare HMO

## 2023-11-25 DIAGNOSIS — M170 Bilateral primary osteoarthritis of knee: Secondary | ICD-10-CM | POA: Diagnosis not present

## 2023-11-26 ENCOUNTER — Other Ambulatory Visit: Payer: Self-pay | Admitting: Cardiology

## 2023-12-01 NOTE — Progress Notes (Signed)
 Added padding inside brace lateral malleolus aspect  Also added lateral wedge on outside of brace  Patient has increased over supination adding more pressure to lateral ankle  Patient will try modifications and will call if any other problems arise

## 2023-12-03 DIAGNOSIS — J069 Acute upper respiratory infection, unspecified: Secondary | ICD-10-CM | POA: Diagnosis not present

## 2023-12-05 ENCOUNTER — Other Ambulatory Visit: Payer: Self-pay | Admitting: Cardiology

## 2023-12-30 ENCOUNTER — Ambulatory Visit: Payer: Medicare HMO | Admitting: Podiatry

## 2023-12-30 DIAGNOSIS — M79675 Pain in left toe(s): Secondary | ICD-10-CM

## 2023-12-30 DIAGNOSIS — L84 Corns and callosities: Secondary | ICD-10-CM

## 2023-12-30 DIAGNOSIS — I739 Peripheral vascular disease, unspecified: Secondary | ICD-10-CM | POA: Diagnosis not present

## 2023-12-30 DIAGNOSIS — M79674 Pain in right toe(s): Secondary | ICD-10-CM | POA: Diagnosis not present

## 2023-12-30 DIAGNOSIS — B351 Tinea unguium: Secondary | ICD-10-CM

## 2023-12-30 NOTE — Progress Notes (Signed)
       Subjective:  Patient ID: Terry Villanueva, male    DOB: 12-22-1935,  MRN: [id number]  Terry Villanueva presents to clinic today for:  Chief Complaint  Patient presents with   [facility]    RFC with callous. Not diabetic and takes Eliquis    Patient notes nails are thick and elongated, causing pain in shoe gear when ambulating.  He has painful calluses bilateral submet 5  PCP is Nabors, Terry Villanueva.  Last seen around 10/07/2023  Past Medical History:  Diagnosis Date   Arthritis 02/21/2018   Carpal tunnel syndrome of left wrist 08/28/2014   Cervical radiculopathy 08/16/2014   Erectile dysfunction due to arterial insufficiency 07/24/2015   Esophageal dysphagia    Essential hypertension 06/07/2017   GERD (gastroesophageal reflux disease) 02/21/2018   Heart disease 07/17/2021   Hypertensive heart disease 06/07/2017   Hypothyroidism 02/21/2018   Impingement syndrome of left shoulder region 09/20/2018   Inflammatory pain 11/05/2022   Kidney stone 01/2022   Long term current use of anticoagulant 12/19/2016   PAF (paroxysmal atrial fibrillation) (HCC) 06/07/2017   Pain in right hand 11/05/2022   Polio    1952   Prostate nodule 07/24/2015   Sciatica     No Known Allergies  Objective:  There were no vitals filed for this visit.  Terry Villanueva is a pleasant 88 y.o. male in NAD. AAO x 3.  Vascular Examination: Patient has palpable DP pulse, absent PT pulse bilateral.  Delayed capillary refill bilateral toes.  Sparse digital hair bilateral.  Proximal to distal cooling WNL bilateral.    Dermatological Examination: Interspaces are clear with no open lesions noted bilateral.  Skin is shiny and atrophic bilateral.  Nails are 3-67mm thick, with yellowish/brown discoloration, subungual debris and distal onycholysis x10.  There is pain with compression of nails x10.  There are hyperkeratotic lesions noted bilateral submet 5.  Patient qualifies for at-risk foot care because of  PVD.  Assessment/Plan: 1. Pain due to onychomycosis of toenails of both feet   2. Callus of foot   3. PVD (peripheral vascular disease) (HCC)    Mycotic nails x10 were sharply debrided with sterile nail nippers and power debriding burr to decrease bulk and length.  Hyperkeratotic lesions x 2, bilateral submet 5, were shaved with #312 blade.  Return in about 3 months (around 03/30/2024) for RFC.   Terry Villanueva, DPM, FACFAS [facility]     [address] [place], [place] [address]                Office [phone]  Fax [phone]

## 2024-01-05 DIAGNOSIS — N401 Enlarged prostate with lower urinary tract symptoms: Secondary | ICD-10-CM | POA: Diagnosis not present

## 2024-01-13 DIAGNOSIS — D044 Carcinoma in situ of skin of scalp and neck: Secondary | ICD-10-CM | POA: Diagnosis not present

## 2024-01-28 DIAGNOSIS — M79651 Pain in right thigh: Secondary | ICD-10-CM | POA: Diagnosis not present

## 2024-01-28 DIAGNOSIS — M25551 Pain in right hip: Secondary | ICD-10-CM | POA: Diagnosis not present

## 2024-02-05 ENCOUNTER — Other Ambulatory Visit: Payer: Self-pay | Admitting: Cardiology

## 2024-02-05 DIAGNOSIS — I480 Paroxysmal atrial fibrillation: Secondary | ICD-10-CM

## 2024-02-08 NOTE — Telephone Encounter (Signed)
 Pt last saw Dr Sandee Crook 07/28/23, last labs 07/19/23, last labs 07/19/23 Creat 0.76, age 88, weight 86.6kg, based on specified criteria pt is on appropriate dosage of Eliquis  5mg  BID for afib.  Will refill rx.

## 2024-02-10 DIAGNOSIS — G629 Polyneuropathy, unspecified: Secondary | ICD-10-CM | POA: Diagnosis not present

## 2024-02-10 DIAGNOSIS — M25551 Pain in right hip: Secondary | ICD-10-CM | POA: Diagnosis not present

## 2024-03-02 ENCOUNTER — Ambulatory Visit: Admitting: Podiatry

## 2024-03-02 DIAGNOSIS — M79675 Pain in left toe(s): Secondary | ICD-10-CM

## 2024-03-02 DIAGNOSIS — L84 Corns and callosities: Secondary | ICD-10-CM | POA: Diagnosis not present

## 2024-03-02 DIAGNOSIS — B351 Tinea unguium: Secondary | ICD-10-CM | POA: Diagnosis not present

## 2024-03-02 DIAGNOSIS — M79674 Pain in right toe(s): Secondary | ICD-10-CM | POA: Diagnosis not present

## 2024-03-02 DIAGNOSIS — I739 Peripheral vascular disease, unspecified: Secondary | ICD-10-CM | POA: Diagnosis not present

## 2024-03-02 NOTE — Progress Notes (Unsigned)
    Subjective:  Patient ID: Terry Villanueva, male    DOB: [birth year],  MRN: [id number]  Terry Villanueva presents to clinic today for:  Chief Complaint  Patient presents with   [facility]    RFC with callous. Not diabetic and takes Eliquis .    Patient notes nails are thick and elongated, causing pain in shoe gear when ambulating.  He has painful calluses bilateral submet 5  PCP is Nabors, Chadwick C, DO.  Last seen 12/03/2023  Past Medical History:  Diagnosis Date   Arthritis 02/21/2018   Carpal tunnel syndrome of left wrist 08/28/2014   Cervical radiculopathy 08/16/2014   Erectile dysfunction due to arterial insufficiency 07/24/2015   Esophageal dysphagia    Essential hypertension 06/07/2017   GERD (gastroesophageal reflux disease) 02/21/2018   Heart disease 07/17/2021   Hypertensive heart disease 06/07/2017   Hypothyroidism 02/21/2018   Impingement syndrome of left shoulder region 09/20/2018   Inflammatory pain 11/05/2022   Kidney stone 01/2022   Long term current use of anticoagulant 12/19/2016   PAF (paroxysmal atrial fibrillation) (HCC) 06/07/2017   Pain in right hand 11/05/2022   Polio    1952   Prostate nodule 07/24/2015   Sciatica    No Known Allergies  Objective:  Terry Villanueva is a pleasant 88 y.o. male in NAD. AAO x 3.  Vascular Examination: Patient has palpable DP pulse, absent PT pulse bilateral.  Delayed capillary refill bilateral toes.  Sparse digital hair bilateral.  Proximal to distal cooling WNL bilateral.    Dermatological Examination: Interspaces are clear with no open lesions noted bilateral.  Skin is shiny and atrophic bilateral.  Nails are 3-50mm thick, with yellowish/brown discoloration, subungual debris and distal onycholysis x10.  There is pain with compression of nails x10.  There are hyperkeratotic lesions noted bilateral submet 5.  Patient qualifies for at-risk foot care because of PVD.  Assessment/Plan: 1. Pain due to onychomycosis of toenails of both  feet   2. Callus of foot   3. PVD (peripheral vascular disease) (HCC)    Mycotic nails x10 were sharply debrided with sterile nail nippers and power debriding burr to decrease bulk and length.  Hyperkeratotic lesions bilateral submet 5 were shaved with #312 blade.  Follow-up in 3 months   Allin Frix D. Mattix Imhof, DPM, FACFAS [facility]     [address] [place], [place] [address]                Office [phone]  Fax [phone]

## 2024-03-09 ENCOUNTER — Ambulatory Visit: Admitting: Cardiology

## 2024-03-09 ENCOUNTER — Ambulatory Visit

## 2024-03-09 VITALS — BP 130/70 | HR 57 | Ht 72.0 in | Wt 188.0 lb

## 2024-03-09 DIAGNOSIS — I110 Hypertensive heart disease with heart failure: Secondary | ICD-10-CM

## 2024-03-09 DIAGNOSIS — I480 Paroxysmal atrial fibrillation: Secondary | ICD-10-CM

## 2024-03-09 DIAGNOSIS — I5032 Chronic diastolic (congestive) heart failure: Secondary | ICD-10-CM

## 2024-03-09 DIAGNOSIS — I2510 Atherosclerotic heart disease of native coronary artery without angina pectoris: Secondary | ICD-10-CM | POA: Diagnosis not present

## 2024-03-09 DIAGNOSIS — I340 Nonrheumatic mitral (valve) insufficiency: Secondary | ICD-10-CM

## 2024-03-09 DIAGNOSIS — I5030 Unspecified diastolic (congestive) heart failure: Secondary | ICD-10-CM | POA: Insufficient documentation

## 2024-03-09 NOTE — Progress Notes (Signed)
 Cardiology Consultation:    Date:  03/09/2024   ID:  Terry Villanueva, DOB 1936/03/04, MRN [id number]  PCP:  Terry Dene BROCKS, DO  Cardiologist:  Terry SAUNDERS Dianne Whelchel, MD   Referring MD: Terry Dene BROCKS, DO   Chief Complaint  Patient presents with   Follow-up     ASSESSMENT AND PLAN:   Terry Villanueva 88 year old male patient with history of paroxysmal atrial fibrillation, remains on anticoagulation with Eliquis , hypertension, CHF with preserved LVEF 55 to 60% on echocardiogram September 2024, mild nonobstructive coronary artery disease [no ischemia on stress test September 2024 at [facility]; cardiac CT coronary angiogram November 2024], mild MR, mild TR and mild pulmonary insufficiency, dyslipidemia, right upper extremity and left lower extremity weakness from childhood polio, here for routine follow-up remains functional and no active cardiac symptoms and continues to use Lasix  on an as-needed basis.   Problem List Items Addressed This Visit     PAF (paroxysmal atrial fibrillation) (HCC) - Primary   Remains in sinus rhythm on today's check using his Apple smart watch.  Remains asymptomatic. CHA2DS2-VASc score 3. Continue anticoagulation with Eliquis  5 mg twice daily [no indication for dose reduction at this time with weight about 60 kg and creatinine below 1.5]. No side effects.  Remains on metoprolol  succinate 25 mg once daily. Uses diltiazem  as needed if heart rates are elevated in A-fib, has not required to use it since discharge from [facility] last September.       Relevant Medications   furosemide  (LASIX ) 20 MG tablet   Hypertensive heart disease   Continue metoprolol  XL 25 mg once daily. Blood pressures well-controlled.       Relevant Medications   furosemide  (LASIX ) 20 MG tablet   CAD (coronary artery disease)   Cardiac CT normal 2024 with CAD RADS 2 study, total plaque volume 250 millimeter cube, calcium  score 146. He remains on anticoagulation  for A-fib with Eliquis . Has been on rosuvastatin  10 mg once daily for the past 6 months      Relevant Medications   furosemide  (LASIX ) 20 MG tablet   (HFpEF) heart failure with preserved ejection fraction (HCC)   Appears euvolemic and compensated. Does have mild ankle edema more on the left lower extremity poorly affected leg in comparison to right lower extremity.  Uses compression socks and has a brace on his left lower extremity.  Continue with salt restriction to below 2 g/day. Continue to use Lasix  as needed 20 mg on days where he has weight gain over the 2 to 3 pounds in a day or 4 to 5 pounds in a week.  Continue with metoprolol  succinate 25 mg once daily. If he does have any further flareups, we will escalate therapy.  In addition of Entresto and SGLT2 inhibitors.      Relevant Medications   furosemide  (LASIX ) 20 MG tablet   Mild mitral regurgitation   Noted on prior echocardiogram September 2024 at [facility], in association with mild TR and mild pulmonary insufficiency. Continue with follow-up echocardiogram tentatively in 18 to 24 months.      Relevant Medications   furosemide  (LASIX ) 20 MG tablet   Return to clinic in 6 months.   History of Present Illness:    Terry Villanueva is a 88 y.o. male who is being seen today for a follow-up visit. PCP is Terry, Dene BROCKS, DO. Last visit with us  in the office was 07/28/2023 with Dr. Monetta.  Here for the visit by himself.  Has  history of paroxysmal atrial fibrillation and on anticoagulation with Eliquis , hypertensive heart disease with heart failure with preserved LVEF, last ischemic workup with stress nuclear imaging September 2024 described fixed defects without any ischemia, echocardiogram showed normal biventricular function with LVEF 55 to 60%, moderately dilated left atrium, mild MR, mild pulmonary insufficiency and mild TR with RVSP 24 mmHg..  Subsequently cardiac CTA done November 2024 showed mild coronary  artery disease with calcium  score of 146 and no obstructive lesions.  [CAD RADS 2 study with total plaque volume 250 mm cube and calcium  score 146]. Also has hyperlipidemia, history of polio with weakness of right upper extremity and left lower extremity.  Has no active cardiac symptoms going on at this time. Denies any chest pain.  Denies any shortness of breath. Good functional capacity. Does notice increased pedal edema at times and uses Lasix  as needed.  Has used Lasix  2 times in the past 2 months. Weight at home typically ranges around 185 to 187 pounds.  Denies any palpitations. Able to monitor his heart rhythm using Kardia mobile device and Apple smart watch. Shows that he is in sinus rhythm using Apple smart watch today in the office.  Good compliance with medications.  Past Medical History:  Diagnosis Date   Arthritis 02/21/2018   Carpal tunnel syndrome of left wrist 08/28/2014   Cervical radiculopathy 08/16/2014   Erectile dysfunction due to arterial insufficiency 07/24/2015   Esophageal dysphagia    Essential hypertension 06/07/2017   GERD (gastroesophageal reflux disease) 02/21/2018   Heart disease 07/17/2021   Hypertensive heart disease 06/07/2017   Hypothyroidism 02/21/2018   Impingement syndrome of left shoulder region 09/20/2018   Inflammatory pain 11/05/2022   Kidney stone 01/2022   Long term current use of anticoagulant 12/19/2016   PAF (paroxysmal atrial fibrillation) (HCC) 06/07/2017   Pain in right hand 11/05/2022   Polio    1952   Prostate nodule 07/24/2015   Sciatica     Past Surgical History:  Procedure Laterality Date   BACK SURGERY     COLONOSCOPY  12/08/2010   Mild sigmoid diverticulosis. Small internal hemorrhoids   ESOPHAGOGASTRODUODENOSCOPY  07/08/2015   Schatzki ring status post esophageal dilatation. Small hiatal hernia   FLEXIBLE SIGMOIDOSCOPY  1999   Dr Anette   KNEE SURGERY     STAPEDES SURGERY     VASECTOMY     WRIST SURGERY Left  07/2016   Carpal Tunnel    Current Medications: Current Meds  Medication Sig   acetaminophen  (TYLENOL ) 650 MG CR tablet Take 650 mg by mouth every 8 (eight) hours as needed for pain.   Calcium  Carbonate-Vit D-Min (CALCIUM  1200 PO) Take 1 tablet by mouth daily.   diltiazem  (CARDIZEM ) 30 MG tablet Take 1 tablet (30 mg total) by mouth every 8 (eight) hours as needed (for heart rate greater than 110 BPM).   ELIQUIS  5 MG TABS tablet TAKE 1 TABLET BY MOUTH TWICE A DAY   famotidine (PEPCID) 20 MG tablet Take 20 mg by mouth every other day. Mon, Wed, Fri,   furosemide  (LASIX ) 20 MG tablet Take 20 mg by mouth daily as needed for edema (weight gain of 3 pds in one night).   gabapentin (NEURONTIN) 100 MG capsule Take 300 mg by mouth at bedtime.   levothyroxine (SYNTHROID) 50 MCG tablet Take 50 mcg by mouth daily.   metoprolol  succinate (TOPROL -XL) 25 MG 24 hr tablet Take 1 tablet (25 mg total) by mouth daily.   Multiple Vitamins-Minerals (MACULAR HEALTH  FORMULA PO) Take 1 tablet by mouth 2 (two) times daily.    omeprazole  (PRILOSEC) 20 MG capsule Take 1 capsule (20 mg total) by mouth every other day.   rosuvastatin  (CRESTOR ) 10 MG tablet Take 1 tablet (10 mg total) by mouth daily.   tamsulosin (FLOMAX) 0.4 MG CAPS capsule Take 0.4 mg by mouth daily.   vitamin C (ASCORBIC ACID) 500 MG tablet Take 1,000 mg by mouth daily.   [DISCONTINUED] furosemide  (LASIX ) 20 MG tablet Take 1 tablet (20 mg total) by mouth daily. After 2 days take as needed for Wt gain of 3 lbs in one night     Allergies:   Patient has no known allergies.   Social History   Socioeconomic History   Marital status: Married    Spouse name: Not on file   Number of children: 3   Years of education: Not on file   Highest education level: Not on file  Occupational History   Not on file  Tobacco Use   Smoking status: Never   Smokeless tobacco: Never  Vaping Use   Vaping status: Never Used  Substance and Sexual Activity   Alcohol  use: Not Currently   Drug use: Not Currently   Sexual activity: Not on file  Other Topics Concern   Not on file  Social History Narrative   Not on file   Social Drivers of Health   Financial Resource Strain: Not on file  Food Insecurity: Not on file  Transportation Needs: Not on file  Physical Activity: Not on file  Stress: Not on file  Social Connections: Not on file     Family History: The patient's family history includes Cancer in his brother and mother; Diabetes in his mother; Heart disease in his brother, father, and mother; Hypertension in his brother; Stroke in his mother. There is no history of Colon cancer, Rectal cancer, or Esophageal cancer. ROS:   Please see the history of present illness.    All 14 point review of systems negative except as described per history of present illness.  EKGs/Labs/Other Studies Reviewed:    The following studies were reviewed today:   EKG:       Recent Labs: 07/19/2023: ALT 17; BUN 13; Creatinine, Ser 0.76; Hemoglobin 13.4; Platelets 258.0; Potassium 4.4; Sodium 140; TSH 8.11  Recent Lipid Panel No results found for: CHOL, TRIG, HDL, CHOLHDL, VLDL, LDLCALC, LDLDIRECT  Physical Exam:    VS:  BP 130/70   Pulse (!) 57   Ht 6' (1.829 m)   Wt 188 lb (85.3 kg)   SpO2 94%   BMI 25.50 kg/m     Wt Readings from Last 3 Encounters:  03/09/24 188 lb (85.3 kg)  07/28/23 191 lb (86.6 kg)  07/19/23 195 lb (88.5 kg)     GENERAL:  Well nourished, well developed in no acute distress NECK: No JVD; No carotid bruits CARDIAC: RRR, S1 and S2 present, no murmurs, no rubs, no gallops CHEST:  Clear to auscultation without rales, wheezing or rhonchi  Extremities: No pitting pedal edema. Pulses bilaterally symmetric with radial 2+ and dorsalis pedis 2+ NEUROLOGIC:  Alert and oriented x 3  Medication Adjustments/Labs and Tests Ordered: Current medicines are reviewed at length with the patient today.  Concerns regarding medicines  are outlined above.  No orders of the defined types were placed in this encounter.  No orders of the defined types were placed in this encounter.   Signed, Terry jess Kobus, MD, MPH, [facility]. 03/09/2024  5:28 PM    [facility]

## 2024-03-09 NOTE — Assessment & Plan Note (Signed)
 Cardiac CT normal 2024 with CAD RADS 2 study, total plaque volume 250 millimeter cube, calcium  score 146. He remains on anticoagulation for A-fib with Eliquis . Has been on rosuvastatin  10 mg once daily for the past 6 months

## 2024-03-09 NOTE — Assessment & Plan Note (Signed)
 Remains in sinus rhythm on today's check using his Apple smart watch.  Remains asymptomatic. CHA2DS2-VASc score 3. Continue anticoagulation with Eliquis  5 mg twice daily [no indication for dose reduction at this time with weight about 60 kg and creatinine below 1.5]. No side effects.  Remains on metoprolol  succinate 25 mg once daily. Uses diltiazem  as needed if heart rates are elevated in A-fib, has not required to use it since discharge from [facility] last September.

## 2024-03-09 NOTE — Patient Instructions (Signed)

## 2024-03-09 NOTE — Assessment & Plan Note (Signed)
 Continue metoprolol  XL 25 mg once daily. Blood pressures well-controlled.

## 2024-03-09 NOTE — Assessment & Plan Note (Signed)
 Noted on prior echocardiogram September 2024 at [facility], in association with mild TR and mild pulmonary insufficiency. Continue with follow-up echocardiogram tentatively in 18 to 24 months.

## 2024-03-09 NOTE — Assessment & Plan Note (Signed)
 Appears euvolemic and compensated. Does have mild ankle edema more on the left lower extremity poorly affected leg in comparison to right lower extremity.  Uses compression socks and has a brace on his left lower extremity.  Continue with salt restriction to below 2 g/day. Continue to use Lasix  as needed 20 mg on days where he has weight gain over the 2 to 3 pounds in a day or 4 to 5 pounds in a week.  Continue with metoprolol  succinate 25 mg once daily. If he does have any further flareups, we will escalate therapy.  In addition of Entresto and SGLT2 inhibitors.

## 2024-05-05 ENCOUNTER — Ambulatory Visit: Admitting: Podiatry

## 2024-05-05 DIAGNOSIS — M79674 Pain in right toe(s): Secondary | ICD-10-CM | POA: Diagnosis not present

## 2024-05-05 DIAGNOSIS — L84 Corns and callosities: Secondary | ICD-10-CM

## 2024-05-05 DIAGNOSIS — B351 Tinea unguium: Secondary | ICD-10-CM

## 2024-05-05 DIAGNOSIS — I739 Peripheral vascular disease, unspecified: Secondary | ICD-10-CM | POA: Diagnosis not present

## 2024-05-05 DIAGNOSIS — M79675 Pain in left toe(s): Secondary | ICD-10-CM

## 2024-05-05 NOTE — Progress Notes (Signed)
    Subjective:  Patient ID: Terry Villanueva, male    DOB: 04-30-1936,  MRN: [id number]  Terry Villanueva presents to clinic today for:  Chief Complaint  Patient presents with   [facility]    RFC with callous Not diabetic  Eliquis .    Patient notes nails are thick and elongated, causing pain in shoe gear when ambulating.  He has painful calluses bilateral submet 5  PCP is Nabors, Chadwick C, DO.  Last seen 12/03/2023  Past Medical History:  Diagnosis Date   Arthritis 02/21/2018   Carpal tunnel syndrome of left wrist 08/28/2014   Cervical radiculopathy 08/16/2014   Erectile dysfunction due to arterial insufficiency 07/24/2015   Esophageal dysphagia    Essential hypertension 06/07/2017   GERD (gastroesophageal reflux disease) 02/21/2018   Heart disease 07/17/2021   Hypertensive heart disease 06/07/2017   Hypothyroidism 02/21/2018   Impingement syndrome of left shoulder region 09/20/2018   Inflammatory pain 11/05/2022   Kidney stone 01/2022   Long term current use of anticoagulant 12/19/2016   PAF (paroxysmal atrial fibrillation) (HCC) 06/07/2017   Pain in right hand 11/05/2022   Polio    1952   Prostate nodule 07/24/2015   Sciatica    No Known Allergies  Objective:  Terry Villanueva is a pleasant 88 y.o. male in NAD. AAO x 3.  Vascular Examination: Patient has palpable DP pulse, absent PT pulse bilateral.  Delayed capillary refill bilateral toes.  Sparse digital hair bilateral.  Proximal to distal cooling WNL bilateral.    Dermatological Examination: Interspaces are clear with no open lesions noted bilateral.  Skin is shiny and atrophic bilateral.  Nails are 3-22mm thick, with yellowish/brown discoloration, subungual debris and distal onycholysis x10.  There is pain with compression of nails x10.  There are hyperkeratotic lesions noted bilateral submet 5.  Patient qualifies for at-risk foot care because of PVD.  Assessment/Plan: 1. Pain due to onychomycosis of toenails of both feet    2. Callus of foot   3. PVD (peripheral vascular disease) (HCC)    Mycotic nails x10 were sharply debrided with sterile nail nippers and power debriding burr to decrease bulk and length.  Hyperkeratotic lesions bilateral submet 5 were shaved with #312 blade.  Follow-up in 3 months   Terry Villanueva D. Terry Villanueva, DPM, FACFAS [facility]     [address] [place], [place] [address]                Office [phone]  Fax [phone]

## 2024-05-14 ENCOUNTER — Other Ambulatory Visit: Payer: Self-pay | Admitting: Cardiology

## 2024-05-18 DIAGNOSIS — Z6824 Body mass index (BMI) 24.0-24.9, adult: Secondary | ICD-10-CM | POA: Diagnosis not present

## 2024-05-18 DIAGNOSIS — M170 Bilateral primary osteoarthritis of knee: Secondary | ICD-10-CM | POA: Diagnosis not present

## 2024-05-21 ENCOUNTER — Other Ambulatory Visit: Payer: Self-pay | Admitting: Cardiology

## 2024-05-23 DIAGNOSIS — Z85828 Personal history of other malignant neoplasm of skin: Secondary | ICD-10-CM | POA: Diagnosis not present

## 2024-05-23 DIAGNOSIS — L814 Other melanin hyperpigmentation: Secondary | ICD-10-CM | POA: Diagnosis not present

## 2024-05-23 DIAGNOSIS — L570 Actinic keratosis: Secondary | ICD-10-CM | POA: Diagnosis not present

## 2024-05-23 DIAGNOSIS — Z08 Encounter for follow-up examination after completed treatment for malignant neoplasm: Secondary | ICD-10-CM | POA: Diagnosis not present

## 2024-05-23 DIAGNOSIS — L821 Other seborrheic keratosis: Secondary | ICD-10-CM | POA: Diagnosis not present

## 2024-05-30 ENCOUNTER — Telehealth: Payer: Self-pay | Admitting: Cardiology

## 2024-05-30 NOTE — Telephone Encounter (Signed)
 Patient c/o Palpitations: STAT if patient c/o lightheadedness, shortness of breath, or chest pain  How long have you had palpitations/irregular HR/ Afib? Are you having the symptoms now?  Went into afib last night. Lasted about 2-3 hours. No current symptoms. Patient says he feels fine now, just a little weak.  Are you currently experiencing lightheadedness, SOB or CP?  No   Do you have a history of afib (atrial fibrillation) or irregular heart rhythm?  Hx afib   Have you checked your BP or HR? (document readings if available):  HR is fine. BP seems okay.  Are you experiencing any other symptoms?  Has been SOB. Not currently.

## 2024-05-30 NOTE — Telephone Encounter (Signed)
 Spoke with pt who states that his watch showed afib last pm for approximately 3-4 hours. Pt states that he took 1 dose of Diltiazem . Pt states that he had a tightness noted in his upper chest/throat while in afib. Pt reports that he has been more short of breath for the past 2 weeks. Appt offered today but declined. Appt made for 05/31/24.

## 2024-05-31 ENCOUNTER — Ambulatory Visit: Attending: Cardiology | Admitting: Cardiology

## 2024-05-31 ENCOUNTER — Encounter: Payer: Self-pay | Admitting: Cardiology

## 2024-05-31 VITALS — BP 120/78 | HR 57 | Ht 72.0 in | Wt 192.0 lb

## 2024-05-31 DIAGNOSIS — I2510 Atherosclerotic heart disease of native coronary artery without angina pectoris: Secondary | ICD-10-CM

## 2024-05-31 DIAGNOSIS — E039 Hypothyroidism, unspecified: Secondary | ICD-10-CM

## 2024-05-31 DIAGNOSIS — I480 Paroxysmal atrial fibrillation: Secondary | ICD-10-CM | POA: Diagnosis not present

## 2024-05-31 DIAGNOSIS — E782 Mixed hyperlipidemia: Secondary | ICD-10-CM | POA: Diagnosis not present

## 2024-05-31 DIAGNOSIS — D6859 Other primary thrombophilia: Secondary | ICD-10-CM

## 2024-05-31 DIAGNOSIS — I10 Essential (primary) hypertension: Secondary | ICD-10-CM | POA: Diagnosis not present

## 2024-05-31 NOTE — Progress Notes (Signed)
 Cardiology Office Note:    Date:  05/31/2024   ID:  Terry Villanueva, DOB 07-Sep-1936, MRN [id number]  PCP:  Conley Dene BROCKS, DO   [facility] Providers Cardiologist:  Alean SAUNDERS Madireddy, MD     Referring MD: Conley Dene BROCKS, DO    History of Present Illness:    Terry Villanueva is a 88 y.o. male with a hx of paroxysmal atrial fibrillation on Eliquis , nonobstructive CAD, hypertension, GERD, hypothyroidism.  06/24/2023 cCTA calcium  score of 146, non-obstructive 06/14/2023 echo EF 55-60%, LA moderately dilates, mild MR, mild PR, mild TR 01/12/2023 echo EF 60-65%, moderate concentric LVH, grade I DD, mild mR  He established care with Dr. Monetta in 2018 following a hospitalization for new onset AF, he had a slight troponin rise, stress test was unremarkable.  He spontaneously converted back to sinus rhythm.  Anticoagulated with Eliquis . Evaluated by Dr. Monetta on 04/08/2022 at that time he was doing well from a cardiac perspective.  No recurrence of atrial fibrillation, his beta-blocker and DOAC were continued. He presented to the office 12/29/22 for shortness of breath, most notable when he was exerting himself. Lab work was unremarkable, proBNP was 207.  We arranged a repeat echocardiogram which revealed an EF of 60 to 65%, moderate concentric LVH, grade 1 DD, normal PASP, mild MR.  Most recently evaluated by Dr. Liborio on 03/09/2024, maintaining sinus rhythm, no changes to medications or plan of care and advised to follow up in 6 months.   He presents today after an episode of atrial fibrillation a few nights ago that woke him up, we reviewed his EKG tracings on his smart watch, he was in A-fib with RVR with rates around 110-130, he did take short acting Cardizem , subsequently fell asleep and when he woke up a few hours later was back in sinus rhythm.  He also mentions that his brother passed away on 06-23-24 preceding this event and he also had been dealing with URI symptoms. He denies  chest pain, palpitations, dyspnea, pnd, orthopnea, n, v, dizziness, syncope, edema, weight gain, or early satiety.   Past Medical History:  Diagnosis Date   Arthritis 02/21/2018   Carpal tunnel syndrome of left wrist 08/28/2014   Cervical radiculopathy 08/16/2014   Erectile dysfunction due to arterial insufficiency 07/24/2015   Esophageal dysphagia    Essential hypertension 06/07/2017   GERD (gastroesophageal reflux disease) 02/21/2018   Heart disease 07/17/2021   Hypertensive heart disease 06/07/2017   Hypothyroidism 02/21/2018   Impingement syndrome of left shoulder region 09/20/2018   Inflammatory pain 11/05/2022   Kidney stone 01/2022   Long term current use of anticoagulant 12/19/2016   PAF (paroxysmal atrial fibrillation) (HCC) 06/07/2017   Pain in right hand 11/05/2022   Polio    1952   Prostate nodule 07/24/2015   Sciatica     Past Surgical History:  Procedure Laterality Date   BACK SURGERY     COLONOSCOPY  12/08/2010   Mild sigmoid diverticulosis. Small internal hemorrhoids   ESOPHAGOGASTRODUODENOSCOPY  07/08/2015   Schatzki ring status post esophageal dilatation. Small hiatal hernia   FLEXIBLE SIGMOIDOSCOPY  1999   Dr Anette   KNEE SURGERY     STAPEDES SURGERY     VASECTOMY     WRIST SURGERY Left 07/2016   Carpal Tunnel    Current Medications: Current Meds  Medication Sig   acetaminophen  (TYLENOL ) 650 MG CR tablet Take 650 mg by mouth every 8 (eight) hours as needed for pain.   Calcium   Carbonate-Vit D-Min (CALCIUM  1200 PO) Take 1 tablet by mouth daily.   diltiazem  (CARDIZEM ) 30 MG tablet Take 1 tablet (30 mg total) by mouth every 8 (eight) hours as needed (for heart rate greater than 110 BPM).   ELIQUIS  5 MG TABS tablet TAKE 1 TABLET BY MOUTH TWICE A DAY   furosemide  (LASIX ) 20 MG tablet Take 20 mg by mouth daily as needed for edema (weight gain of 3 pds in one night).   gabapentin (NEURONTIN) 100 MG capsule Take 100 mg by mouth at bedtime.   levothyroxine  (SYNTHROID) 50 MCG tablet Take 50 mcg by mouth daily.   metoprolol  succinate (TOPROL -XL) 25 MG 24 hr tablet TAKE 1 TABLET (25 MG TOTAL) BY MOUTH DAILY.   Multiple Vitamins-Minerals (MACULAR HEALTH FORMULA PO) Take 1 tablet by mouth 2 (two) times daily.    omeprazole  (PRILOSEC) 20 MG capsule Take 20 mg by mouth daily.   rosuvastatin  (CRESTOR ) 10 MG tablet TAKE 1 TABLET BY MOUTH EVERY DAY   tamsulosin (FLOMAX) 0.4 MG CAPS capsule Take 0.4 mg by mouth daily.   vitamin C (ASCORBIC ACID) 500 MG tablet Take 1,000 mg by mouth daily.     Allergies:   Patient has no known allergies.   Social History   Socioeconomic History   Marital status: Married    Spouse name: Not on file   Number of children: 3   Years of education: Not on file   Highest education level: Not on file  Occupational History   Not on file  Tobacco Use   Smoking status: Never   Smokeless tobacco: Never  Vaping Use   Vaping status: Never Used  Substance and Sexual Activity   Alcohol use: Not Currently   Drug use: Not Currently   Sexual activity: Not on file  Other Topics Concern   Not on file  Social History Narrative   Not on file   Social Drivers of Health   Financial Resource Strain: Not on file  Food Insecurity: Not on file  Transportation Needs: Not on file  Physical Activity: Not on file  Stress: Not on file  Social Connections: Not on file     Family History: The patient's family history includes Cancer in his brother and mother; Diabetes in his mother; Heart disease in his brother, father, and mother; Hypertension in his brother; Stroke in his mother. There is no history of Colon cancer, Rectal cancer, or Esophageal cancer.  ROS:   Please see the history of present illness.     All other systems reviewed and are negative.  EKGs/Labs/Other Studies Reviewed:    The following studies were reviewed today:  Echocardiogram 11/30/2016-EF 60 to 65%, mild dilatation of left atrium, mild to moderate MR,  mild TR.  EKG:    EKG Interpretation Date/Time:  Wednesday May 31 2024 14:57:46 EDT Ventricular Rate:  57 PR Interval:  178 QRS Duration:  96 QT Interval:  410 QTC Calculation: 399 R Axis:   -31  Text Interpretation: Sinus bradycardia Left axis deviation Septal infarct (cited on or before 16-Jun-2023) When compared with ECG of 16-Jun-2023 08:48, Premature ventricular complexes are no longer Present Confirmed by Carlin Nest [phone]) on 05/31/2024 3:05:09 PM         Recent Labs: 07/19/2023: ALT 17; BUN 13; Creatinine, Ser 0.76; Hemoglobin 13.4; Platelets 258.0; Potassium 4.4; Sodium 140; TSH 8.11  Recent Lipid Panel No results found for: CHOL, TRIG, HDL, CHOLHDL, VLDL, LDLCALC, LDLDIRECT   Risk Assessment/Calculations:    CHA2DS2-VASc  Score = 4   This indicates a 4.8% annual risk of stroke. The patient's score is based upon: CHF History: 0 HTN History: 1 Diabetes History: 0 Stroke History: 0 Vascular Disease History: 1 Age Score: 2 Gender Score: 0                Physical Exam:    VS:  BP 120/78   Pulse (!) 57   Ht 6' (1.829 m)   Wt 192 lb (87.1 kg)   SpO2 95%   BMI 26.04 kg/m     Wt Readings from Last 3 Encounters:  05/31/24 192 lb (87.1 kg)  03/09/24 188 lb (85.3 kg)  07/28/23 191 lb (86.6 kg)     GEN: Appears younger than stated age, well nourished, well developed in no acute distress HEENT: Normal NECK: No JVD; No carotid bruits LYMPHATICS: No lymphadenopathy CARDIAC: RRR, no murmurs, rubs, gallops RESPIRATORY: Clear ABDOMEN: Soft, non-tender, non-distended MUSCULOSKELETAL: Trace edema; left lower ankle in brace due to polio SKIN: Warm and dry NEUROLOGIC:  Alert and oriented x 3 PSYCHIATRIC:  Normal affect   ASSESSMENT:    1. PAF (paroxysmal atrial fibrillation) (HCC)   2. Hypercoagulable state (HCC)   3. Essential hypertension   4. Hypothyroidism, unspecified type   5. Mild CAD   6. Mixed hyperlipidemia       PLAN:    In order of problems listed above:  CAD-nonobstructive per coronary CTA, Stable with no anginal symptoms. No indication for ischemic evaluation.  Is on Eliquis  therefore he is not on aspirin.  Paroxysmal atrial fibrillation/hypercoagulable state-he is in sinus rhythm today, keenly aware when his heart is out of rhythm, monitors his EKG on his Apple watch.  Continue Eliquis  5 mg twice a day, no indication for dose reduction and tolerating without any adverse bleeding effects.  Continue metoprolol  25 mg daily, Cardizem  30 mg as needed for heart rate greater than 110.  Repeat CBC, BMET.  Hypertension-blood pressure is controlled today at 138/70, continue metoprolol  20 mg daily.  Hypothyroidism-currently on Synthroid, will check thyroid  panel for any contributory causes for episode of A-fib.  Dyslipidemia-currently on Crestor  10 mg daily, appears to be monitored by his PCP.   Disposition-CBC, CMET, thyroid  panel, keep follow-up with Dr. Liborio.       Medication Adjustments/Labs and Tests Ordered: Current medicines are reviewed at length with the patient today.  Concerns regarding medicines are outlined above.  Orders Placed This Encounter  Procedures   CBC   Comprehensive metabolic panel with GFR   T3, free   T4, free   Thyroid  Panel With TSH   EKG 12-Lead   No orders of the defined types were placed in this encounter.   Patient Instructions  Medication Instructions:  Your physician recommends that you continue on your current medications as directed. Please refer to the Current Medication list given to you today.  *If you need a refill on your cardiac medications before your next appointment, please call your pharmacy*   Lab Work: Your physician recommends that you have a CMP, CBC, thyroid  panel, T3 and T4 today in the office.  If you have labs (blood work) drawn today and your tests are completely normal, you will receive your results only by: MyChart  Message (if you have MyChart) OR A paper copy in the mail If you have any lab test that is abnormal or we need to change your treatment, we will call you to review the results.   Testing/Procedures: None ordered  Follow-Up: At [facility], you and your health needs are our priority.  As part of our continuing mission to provide you with exceptional heart care, we have created designated Provider Care Teams.  These Care Teams include your primary Cardiologist (physician) and Advanced Practice Providers (APPs -  Physician Assistants and Nurse Practitioners) who all work together to provide you with the care you need, when you need it.  We recommend signing up for the patient portal called MyChart.  Sign up information is provided on this After Visit Summary.  MyChart is used to connect with patients for Virtual Visits (Telemedicine).  Patients are able to view lab/test results, encounter notes, upcoming appointments, etc.  Non-urgent messages can be sent to your provider as well.   To learn more about what you can do with MyChart, go to [email or web address].    Your next appointment:   Follow as directed  The format for your next appointment:   In Person  Provider:   Alean Kobus, MD    Other Instructions none  Important Information About Sugar        Signed, Delon JAYSON Hoover, NP  05/31/2024 6:04 PM    [facility]

## 2024-05-31 NOTE — Patient Instructions (Signed)
 Medication Instructions:  Your physician recommends that you continue on your current medications as directed. Please refer to the Current Medication list given to you today.  *If you need a refill on your cardiac medications before your next appointment, please call your pharmacy*   Lab Work: Your physician recommends that you have a CMP, CBC, thyroid  panel, T3 and T4 today in the office.  If you have labs (blood work) drawn today and your tests are completely normal, you will receive your results only by: MyChart Message (if you have MyChart) OR A paper copy in the mail If you have any lab test that is abnormal or we need to change your treatment, we will call you to review the results.   Testing/Procedures: None ordered   Follow-Up: At [facility], you and your health needs are our priority.  As part of our continuing mission to provide you with exceptional heart care, we have created designated Provider Care Teams.  These Care Teams include your primary Cardiologist (physician) and Advanced Practice Providers (APPs -  Physician Assistants and Nurse Practitioners) who all work together to provide you with the care you need, when you need it.  We recommend signing up for the patient portal called MyChart.  Sign up information is provided on this After Visit Summary.  MyChart is used to connect with patients for Virtual Visits (Telemedicine).  Patients are able to view lab/test results, encounter notes, upcoming appointments, etc.  Non-urgent messages can be sent to your provider as well.   To learn more about what you can do with MyChart, go to [email or web address].    Your next appointment:   Follow as directed  The format for your next appointment:   In Person  Provider:   Alean Kobus, MD    Other Instructions none  Important Information About Sugar

## 2024-06-01 ENCOUNTER — Ambulatory Visit: Payer: Self-pay | Admitting: Cardiology

## 2024-06-01 LAB — COMPREHENSIVE METABOLIC PANEL WITH GFR
ALT: 16 IU/L (ref 0–44)
AST: 28 IU/L (ref 0–40)
Albumin: 4.1 g/dL (ref 3.7–4.7)
Alkaline Phosphatase: 50 IU/L (ref 48–129)
BUN/Creatinine Ratio: 20 (ref 10–24)
BUN: 15 mg/dL (ref 8–27)
Bilirubin Total: 0.3 mg/dL (ref 0.0–1.2)
CO2: 23 mmol/L (ref 20–29)
Calcium: 9.1 mg/dL (ref 8.6–10.2)
Chloride: 104 mmol/L (ref 96–106)
Creatinine, Ser: 0.74 mg/dL — ABNORMAL LOW (ref 0.76–1.27)
Globulin, Total: 2.1 g/dL (ref 1.5–4.5)
Glucose: 156 mg/dL — ABNORMAL HIGH (ref 70–99)
Potassium: 4.4 mmol/L (ref 3.5–5.2)
Sodium: 140 mmol/L (ref 134–144)
Total Protein: 6.2 g/dL (ref 6.0–8.5)
eGFR: 88 mL/min/1.73

## 2024-06-01 LAB — CBC
Hematocrit: 38.1 % (ref 37.5–51.0)
Hemoglobin: 12.7 g/dL — ABNORMAL LOW (ref 13.0–17.7)
MCH: 32.8 pg (ref 26.6–33.0)
MCHC: 33.3 g/dL (ref 31.5–35.7)
MCV: 98 fL — ABNORMAL HIGH (ref 79–97)
Platelets: 248 x10E3/uL (ref 150–450)
RBC: 3.87 x10E6/uL — ABNORMAL LOW (ref 4.14–5.80)
RDW: 11.8 % (ref 11.6–15.4)
WBC: 6 x10E3/uL (ref 3.4–10.8)

## 2024-06-01 LAB — THYROID PANEL WITH TSH
Free Thyroxine Index: 1.4 (ref 1.2–4.9)
T3 Uptake Ratio: 26 % (ref 24–39)
T4, Total: 5.5 ug/dL (ref 4.5–12.0)
TSH: 10 u[IU]/mL — ABNORMAL HIGH (ref 0.450–4.500)

## 2024-06-01 LAB — T3, FREE: T3, Free: 2.8 pg/mL (ref 2.0–4.4)

## 2024-06-01 LAB — T4, FREE: Free T4: 0.96 ng/dL (ref 0.82–1.77)

## 2024-06-06 DIAGNOSIS — Z6825 Body mass index (BMI) 25.0-25.9, adult: Secondary | ICD-10-CM | POA: Diagnosis not present

## 2024-06-06 DIAGNOSIS — Z23 Encounter for immunization: Secondary | ICD-10-CM | POA: Diagnosis not present

## 2024-06-06 DIAGNOSIS — E039 Hypothyroidism, unspecified: Secondary | ICD-10-CM | POA: Diagnosis not present

## 2024-06-06 DIAGNOSIS — K5900 Constipation, unspecified: Secondary | ICD-10-CM | POA: Diagnosis not present

## 2024-06-06 DIAGNOSIS — R300 Dysuria: Secondary | ICD-10-CM | POA: Diagnosis not present

## 2024-06-06 DIAGNOSIS — R102 Pelvic and perineal pain: Secondary | ICD-10-CM | POA: Diagnosis not present

## 2024-07-07 ENCOUNTER — Ambulatory Visit: Admitting: Podiatry

## 2024-07-07 DIAGNOSIS — I739 Peripheral vascular disease, unspecified: Secondary | ICD-10-CM

## 2024-07-07 DIAGNOSIS — M79675 Pain in left toe(s): Secondary | ICD-10-CM

## 2024-07-07 DIAGNOSIS — L84 Corns and callosities: Secondary | ICD-10-CM

## 2024-07-07 DIAGNOSIS — M79674 Pain in right toe(s): Secondary | ICD-10-CM

## 2024-07-07 DIAGNOSIS — B351 Tinea unguium: Secondary | ICD-10-CM

## 2024-07-07 NOTE — Progress Notes (Signed)
    Subjective:  Patient ID: Terry Villanueva, male    DOB: 04/30/1936,  MRN: [id number]  Terry Villanueva presents to clinic today for:  Chief Complaint  Patient presents with   Terry Villanueva    RFC with callous Not diabetic Elliquis.    Patient notes nails are thick and elongated, causing pain in shoe gear when ambulating.  He has painful calluses bilateral submet 5  PCP is Nabors, Chadwick C, DO.  Last seen 02/10/2024  Past Medical History:  Diagnosis Date   Arthritis 02/21/2018   Carpal tunnel syndrome of left wrist 08/28/2014   Cervical radiculopathy 08/16/2014   Erectile dysfunction due to arterial insufficiency 07/24/2015   Esophageal dysphagia    Essential hypertension 06/07/2017   GERD (gastroesophageal reflux disease) 02/21/2018   Heart disease 07/17/2021   Hypertensive heart disease 06/07/2017   Hypothyroidism 02/21/2018   Impingement syndrome of left shoulder region 09/20/2018   Inflammatory pain 11/05/2022   Kidney stone 01/2022   Long term current use of anticoagulant 12/19/2016   PAF (paroxysmal atrial fibrillation) (HCC) 06/07/2017   Pain in right hand 11/05/2022   Polio    1952   Prostate nodule 07/24/2015   Sciatica    No Known Allergies  Objective:  Terry Villanueva is a pleasant 88 y.o. male in NAD. AAO x 3.  Vascular Examination: Patient has palpable DP pulse, absent PT pulse bilateral.  Delayed capillary refill bilateral toes.  Sparse digital hair bilateral.  Proximal to distal cooling WNL bilateral.    Dermatological Examination: Interspaces are clear with no open lesions noted bilateral.  Skin is shiny and atrophic bilateral.  Nails are 3-61mm thick, with yellowish/brown discoloration, subungual debris and distal onycholysis x10.  There is pain with compression of nails x10.  There are hyperkeratotic lesions noted bilateral submet 5.  Patient qualifies for at-risk foot care because of PVD.  Assessment/Plan: 1. Pain due to onychomycosis of toenails of both feet    2. Callus of foot   3. PVD (peripheral vascular disease)    Mycotic nails x10 were sharply debrided with sterile nail nippers and power debriding burr to decrease bulk and length.  Hyperkeratotic lesions bilateral submet 5 were shaved with #312 blade.  Follow-up in 3 months   Corrie Reder D. Yaroslav Gombos, DPM, FACFAS [facility]     [address] [place], [place] [address]                Office [phone]  Fax [phone]

## 2024-07-10 DIAGNOSIS — H353131 Nonexudative age-related macular degeneration, bilateral, early dry stage: Secondary | ICD-10-CM | POA: Diagnosis not present

## 2024-07-20 DIAGNOSIS — Z125 Encounter for screening for malignant neoplasm of prostate: Secondary | ICD-10-CM | POA: Diagnosis not present

## 2024-07-20 DIAGNOSIS — K5909 Other constipation: Secondary | ICD-10-CM | POA: Diagnosis not present

## 2024-07-20 DIAGNOSIS — N401 Enlarged prostate with lower urinary tract symptoms: Secondary | ICD-10-CM | POA: Diagnosis not present

## 2024-07-21 ENCOUNTER — Other Ambulatory Visit: Payer: Self-pay | Admitting: Gastroenterology

## 2024-08-01 ENCOUNTER — Telehealth: Payer: Self-pay | Admitting: Gastroenterology

## 2024-08-01 NOTE — Telephone Encounter (Signed)
 Is it ok to write script since we never have?

## 2024-08-01 NOTE — Telephone Encounter (Signed)
 Inbound call from patient stating he needs a refill for Prilosec and is requesting prescription [facility] on [address] Patient has been scheduled for an office visit on  1/9 at 11:20. Please advise.

## 2024-08-02 ENCOUNTER — Other Ambulatory Visit: Payer: Self-pay | Admitting: Cardiology

## 2024-08-02 DIAGNOSIS — I480 Paroxysmal atrial fibrillation: Secondary | ICD-10-CM

## 2024-08-02 NOTE — Telephone Encounter (Signed)
 Prescription refill request for Eliquis  received. Indication:afib Last office visit:9/25 Scr:0.74  9/25 Age: 88 Weight:87.1  kg  Prescription refilled

## 2024-08-03 MED ORDER — OMEPRAZOLE 20 MG PO CPDR: 20.0000 mg | DELAYED_RELEASE_CAPSULE | Freq: Every day | ORAL | 3 refills | Status: AC

## 2024-08-03 NOTE — Telephone Encounter (Signed)
Pl do RG

## 2024-08-03 NOTE — Telephone Encounter (Signed)
 Done

## 2024-09-11 ENCOUNTER — Ambulatory Visit

## 2024-09-11 VITALS — BP 142/70 | HR 62 | Ht 72.0 in | Wt 188.1 lb

## 2024-09-11 DIAGNOSIS — I340 Nonrheumatic mitral (valve) insufficiency: Secondary | ICD-10-CM

## 2024-09-11 DIAGNOSIS — I480 Paroxysmal atrial fibrillation: Secondary | ICD-10-CM | POA: Diagnosis not present

## 2024-09-11 DIAGNOSIS — I5032 Chronic diastolic (congestive) heart failure: Secondary | ICD-10-CM | POA: Diagnosis not present

## 2024-09-11 DIAGNOSIS — I110 Hypertensive heart disease with heart failure: Secondary | ICD-10-CM | POA: Diagnosis not present

## 2024-09-11 DIAGNOSIS — I2510 Atherosclerotic heart disease of native coronary artery without angina pectoris: Secondary | ICD-10-CM

## 2024-09-11 DIAGNOSIS — Z6824 Body mass index (BMI) 24.0-24.9, adult: Secondary | ICD-10-CM | POA: Diagnosis not present

## 2024-09-11 DIAGNOSIS — J101 Influenza due to other identified influenza virus with other respiratory manifestations: Secondary | ICD-10-CM | POA: Diagnosis not present

## 2024-09-11 DIAGNOSIS — R509 Fever, unspecified: Secondary | ICD-10-CM | POA: Diagnosis not present

## 2024-09-11 NOTE — Assessment & Plan Note (Signed)
 Blood pressure is well-controlled. Target blood pressure below 140/80 mmHg. Continue metoprolol  XL 25 mg once daily.

## 2024-09-11 NOTE — Patient Instructions (Signed)
 Medication Instructions:  Your physician recommends that you continue on your current medications as directed. Please refer to the Current Medication list given to you today.  *If you need a refill on your cardiac medications before your next appointment, please call your pharmacy*   Lab Work: None ordered If you have labs (blood work) drawn today and your tests are completely normal, you will receive your results only by: MyChart Message (if you have MyChart) OR A paper copy in the mail If you have any lab test that is abnormal or we need to change your treatment, we will call you to review the results.   Testing/Procedures: You are scheduled for a Myocardial Perfusion Imaging Study.  Please arrive 15 minutes prior to your appointment time for registration and insurance purposes.  The test will take approximately 3 to 4 hours to complete; you may bring reading material.  If someone comes with you to your appointment, they will need to remain in the main lobby due to limited space in the testing area.   How to prepare for your Myocardial Perfusion Test: Do not eat or drink 3 hours prior to your test, except you may have water. Do not consume products containing caffeine (regular or decaffeinated) 12 hours prior to your test. (ex: coffee, chocolate, sodas, tea). Do bring a list of your current medications with you.  If not listed below, you may take your medications as normal. Do not take metoprolol  (Lopressor , Toprol ) for 24 hours prior to the test.  Bring the medication to your appointment as you may be required to take it once the test is complete. Do wear comfortable clothes (no dresses or overalls) and walking shoes, tennis shoes preferred (No heels or open toe shoes are allowed). Do NOT wear cologne, perfume, aftershave, or lotions (deodorant is allowed). If these instructions are not followed, your test will have to be rescheduled.  If you cannot keep your appointment, please  provide 24 hours notification to the Nuclear Lab, to avoid a possible $50 charge to your account.  Follow-Up: At [facility], you and your health needs are our priority.  As part of our continuing mission to provide you with exceptional heart care, we have created designated Provider Care Teams.  These Care Teams include your primary Cardiologist (physician) and Advanced Practice Providers (APPs -  Physician Assistants and Nurse Practitioners) who all work together to provide you with the care you need, when you need it.  We recommend signing up for the patient portal called MyChart.  Sign up information is provided on this After Visit Summary.  MyChart is used to connect with patients for Virtual Visits (Telemedicine).  Patients are able to view lab/test results, encounter notes, upcoming appointments, etc.  Non-urgent messages can be sent to your provider as well.   To learn more about what you can do with MyChart, go to [email or web address].    Your next appointment:   6 month(s)  Provider:   Alean Kobus, MD   Other Instructions  Cardiac Nuclear Scan A cardiac nuclear scan is a test that is done to check the flow of blood to your heart. It is done when you are resting and when you are exercising. The test looks for problems such as: Not enough blood reaching a portion of the heart. The heart muscle not working as it should. You may need this test if you have: Heart disease. Lab results that are not normal. Had heart surgery or a balloon procedure to  open up blocked arteries (angioplasty) or a small mesh tube (stent). Chest pain. Shortness of breath. Had a heart attack. In this test, a special dye (tracer) is put into your bloodstream. The tracer will travel to your heart. A camera will then take pictures of your heart to see how the tracer moves through your heart. This test is usually done at a hospital and takes 2-4 hours. Tell a doctor about: Any allergies you  have. All medicines you are taking, including vitamins, herbs, eye drops, creams, and over-the-counter medicines. Any bleeding problems you have. Any surgeries you have had. Any medical conditions you have. Whether you are pregnant or may be pregnant. Any history of asthma or long-term (chronic) lung disease. Any history of heart rhythm disorders or heart valve conditions. What are the risks? Your doctor will talk with you about risks. These may include: Serious chest pain and heart attack. This is only a risk if the stress portion of the test is done. Fast or uneven heartbeats (palpitations). A feeling of warmth in your chest. This feeling usually does not last long. Allergic reaction to the tracer. Shortness of breath or trouble breathing. What happens before the test? Ask your doctor about changing or stopping your normal medicines. Follow instructions from your doctor about what you cannot eat or drink. Remove your jewelry on the day of the test. Ask your doctor if you need to avoid nicotine or caffeine. What happens during the test? An IV tube will be inserted into one of your veins. Your doctor will give you a small amount of tracer through the IV tube. You will wait for 20-40 minutes while the tracer moves through your bloodstream. Your heart will be monitored with an electrocardiogram (ECG). You will lie down on an exam table. Pictures of your heart will be taken for about 15-20 minutes. You may also have a stress test. For this test, one of these things may be done: You will be asked to exercise on a treadmill or a stationary bike. You will be given medicines that will make your heart work harder. This is done if you are unable to exercise. When blood flow to your heart has peaked, a tracer will again be given through the IV tube. After 20-40 minutes, you will get back on the exam table. More pictures will be taken of your heart. Depending on the tracer that is used, more  pictures may need to be taken 3-4 hours later. Your IV tube will be removed when the test is over. The test may vary among doctors and hospitals. What happens after the test? Ask your doctor: Whether you can return to your normal schedule, including diet, activities, travel, and medicines. Whether you should drink more fluids. This will help to remove the tracer from your body. Ask your doctor, or the department that is doing the test: When will my results be ready? How will I get my results? What are my treatment options? What other tests do I need? What are my next steps? This information is not intended to replace advice given to you by your health care provider. Make sure you discuss any questions you have with your health care provider. Document Revised: 01/27/2022 Document Reviewed: 01/27/2022 Elsevier Patient Education  2023 [employer].

## 2024-09-11 NOTE — Assessment & Plan Note (Signed)
 Infrequent episodes of atrial fibrillation. Last episode over 3 months ago.  CHA2DS2-VASc score 3. Continue anticoagulation with Eliquis  5 mg twice daily.  Remains on metoprolol  XL 25 mg once daily. Uses diltiazem  30 mg dose on an as-needed basis for any episode of A-fib.  Last used over 3 months ago.

## 2024-09-11 NOTE — Progress Notes (Signed)
 "  Cardiology Consultation:    Date:  09/11/2024   ID:  Terry Villanueva, DOB [birth year], MRN [id number]  PCP:  Conley Dene BROCKS., MD  Cardiologist:  Alean SAUNDERS Aspen Lawrance, MD   Referring MD: Conley Dene BROCKS., MD   No chief complaint on file.    ASSESSMENT AND PLAN:   Mr Citro 88-year male history of paroxysmal atrial fibrillation, remains on anticoagulation with Eliquis  (initially diagnosed in 2018 during hospitalization, spontaneously converted; recent episode waking him up from sleep identified on smart watch at home spontaneously converted after he fell asleep taking Cardizem  at night], hypertension, CHF with preserved LVEF 55 to 60% on echocardiogram September 2024, mild nonobstructive coronary artery disease [no ischemia on stress test September 2024 at [facility]; cardiac CT coronary angiogram November 2024 with Ca score 146], mild MR, mild TR and mild pulmonary insufficiency on echocardiogram September 2024 at [facility], dyslipidemia, right upper extremity and left lower extremity weakness from childhood polio, hypothyroidism [on levothyroxine replacement].    Problem List Items Addressed This Visit       Cardiovascular and Mediastinum   PAF (paroxysmal atrial fibrillation) (HCC)   Infrequent episodes of atrial fibrillation. Last episode over 3 months ago.  CHA2DS2-VASc score 3. Continue anticoagulation with Eliquis  5 mg twice daily.  Remains on metoprolol  XL 25 mg once daily. Uses diltiazem  30 mg dose on an as-needed basis for any episode of A-fib.  Last used over 3 months ago.       Hypertensive heart disease   Blood pressure is well-controlled. Target blood pressure below 140/80 mmHg. Continue metoprolol  XL 25 mg once daily.       CAD (coronary artery disease) - Primary   Cardiac CT November 2024 with CAD RADS 2 study, calcium  score 146 and total plaque volume to 50 mm cube.  Now with symptoms of progressive dyspnea on exertion walking  up an incline over the past year.  Will proceed with Lexiscan stress test with nuclear imaging to rule out any significant ischemia burden.  Continue with his Eliquis  as current. Continue with rosuvastatin  10 mg once daily.  Advised him to reach out to our office or call 911 or go to the ER if his symptoms acutely worsen or has symptoms of chest pain or shortness of breath not relieving quickly with rest.      Relevant Orders   MYOCARDIAL PERFUSION IMAGING   (HFpEF) heart failure with preserved ejection fraction (HCC)   Euvolemic and compensated. Symptoms well-controlled. Continue salt restriction below 2 g/day. Continue Lasix  on an as-needed basis.  Has not required to take any doses in several months.  Continue with metoprolol  succinate 25 mg once daily. Will hold off on escalating any further therapy at this time      Mild mitral regurgitation   Will consider follow-up echocardiogram tentatively at next visit in 6 months.       Return to clinic based on stress test results, if unremarkable return for follow-up in 6 months.  History of Present Illness:    Terry Villanueva is a 88 y.o. male who is being seen today for follow-up visit. PCP is Conley Dene BROCKS., MD. Last visit with me in the office was 03/09/2024. Subsequently had follow-up visit with Delon Hoover, NP-C 05/31/2024.  history of paroxysmal atrial fibrillation, remains on anticoagulation with Eliquis  (initially diagnosed in 2018 during hospitalization, spontaneously converted; recent episode waking him up from sleep identified on smart watch at home spontaneously converted after he fell asleep taking  Cardizem  at night], hypertension, CHF with preserved LVEF 55 to 60% on echocardiogram September 2024, mild nonobstructive coronary artery disease [no ischemia on stress test September 2024 at [facility]; cardiac CT coronary angiogram November 2024 with Ca score 146], mild MR, mild TR and mild pulmonary  insufficiency on echocardiogram September 2024 at [facility], dyslipidemia, right upper extremity and left lower extremity weakness from childhood polio, hypothyroidism [on levothyroxine replacement].  Pleasant man here for visit by himself.  Mentions overall he has been doing well and at his baseline but has been noticing a gradual increase in sensation of shortness of breath walking up an incline in comparison to a year ago.  This has been a gradual decline.  Denies any significant symptoms with day-to-day activities at home and notes he can walk longer distances on level ground. Also notices a sensation of shortness of breath having his arm elevated for activities such as hanging up his clothes or changing a light bulb.  Denies any significant episodes of palpitations. His reported no significant breakthrough episodes of atrial fibrillation since his last visit with Delon Cleaves in September 2025.  Reports trace pedal edema at times towards the end of the day and more noticeable on the left leg.  Uses compression socks.  Denies any blood in urine or stools. Denies any fever or chills. Denies any rash or chest wall injuries.  Good compliance with his medications. His TSH levels have improved with the uptitration of his levothyroxine by PCP and last TSH levels from 07/17/2024 shows improvement to 2.63.   Past Medical History:  Diagnosis Date   (HFpEF) heart failure with preserved ejection fraction (HCC) 03/09/2024   Arthritis 02/21/2018   CAD (coronary artery disease) 03/09/2024   Carpal tunnel syndrome of left wrist 08/28/2014   Cervical radiculopathy 08/16/2014   Erectile dysfunction due to arterial insufficiency 07/24/2015   Esophageal dysphagia    Essential hypertension 06/07/2017   GERD (gastroesophageal reflux disease) 02/21/2018   Heart disease 07/17/2021   Hypertensive heart disease 06/07/2017   Hypothyroidism 02/21/2018   Impingement syndrome of left  shoulder region 09/20/2018   Inflammatory pain 11/05/2022   Kidney stone 01/2022   Long term current use of anticoagulant 12/19/2016   Mild mitral regurgitation 03/09/2024   PAF (paroxysmal atrial fibrillation) (HCC) 06/07/2017   Pain in right hand 11/05/2022   Polio    1952   Prostate nodule 07/24/2015   Sciatica     Past Surgical History:  Procedure Laterality Date   BACK SURGERY     COLONOSCOPY  12/08/2010   Mild sigmoid diverticulosis. Small internal hemorrhoids   ESOPHAGOGASTRODUODENOSCOPY  07/08/2015   Schatzki ring status post esophageal dilatation. Small hiatal hernia   FLEXIBLE SIGMOIDOSCOPY  1999   Dr Anette   KNEE SURGERY     STAPEDES SURGERY     VASECTOMY     WRIST SURGERY Left 07/2016   Carpal Tunnel    Current Medications: Active Medications[1]   Allergies:   Patient has no known allergies.   Social History   Socioeconomic History   Marital status: Married    Spouse name: Not on file   Number of children: 3   Years of education: Not on file   Highest education level: Not on file  Occupational History   Not on file  Tobacco Use   Smoking status: Never   Smokeless tobacco: Never  Vaping Use   Vaping status: Never Used  Substance and Sexual Activity   Alcohol use: Not  Currently   Drug use: Not Currently   Sexual activity: Not on file  Other Topics Concern   Not on file  Social History Narrative   Not on file   Social Drivers of Health   Tobacco Use: Low Risk (09/11/2024)   Patient History    Smoking Tobacco Use: Never    Smokeless Tobacco Use: Never    Passive Exposure: Not on file  Financial Resource Strain: Not on file  Food Insecurity: Not on file  Transportation Needs: Not on file  Physical Activity: Not on file  Stress: Not on file  Social Connections: Not on file  Depression (EYV7-0): Not on file  Alcohol Screen: Not on file  Housing: Not on file  Utilities: Not on file  Health Literacy: Not on file     Family  History: The patient's family history includes Cancer in his brother and mother; Diabetes in his mother; Heart disease in his brother, father, and mother; Hypertension in his brother; Stroke in his mother. There is no history of Colon cancer, Rectal cancer, or Esophageal cancer. ROS:   Please see the history of present illness.    All 14 point review of systems negative except as described per history of present illness.  EKGs/Labs/Other Studies Reviewed:    The following studies were reviewed today:   EKG:       Recent Labs: 05/31/2024: ALT 16; BUN 15; Creatinine, Ser 0.74; Hemoglobin 12.7; Platelets 248; Potassium 4.4; Sodium 140; TSH 10.000  Recent Lipid Panel No results found for: CHOL, TRIG, HDL, CHOLHDL, VLDL, LDLCALC, LDLDIRECT  Physical Exam:    VS:  BP (!) 142/70   Pulse 62   Ht 6' (1.829 m)   Wt 188 lb 2 oz (85.3 kg)   SpO2 98%   BMI 25.51 kg/m     Wt Readings from Last 3 Encounters:  09/11/24 188 lb 2 oz (85.3 kg)  05/31/24 192 lb (87.1 kg)  03/09/24 188 lb (85.3 kg)     GENERAL:  Well nourished, well developed in no acute distress NECK: No JVD; No carotid bruits CARDIAC: RRR, S1 and S2 present, no murmurs, no rubs, no gallops CHEST:  Clear to auscultation without rales, wheezing or rhonchi  Extremities: No pitting pedal edema. Pulses bilaterally symmetric with radial 2+ and dorsalis pedis 2+.  Has compression socks on.  Has a left foot/ankle brace for support extending up to midway to the calf. NEUROLOGIC:  Alert and oriented x 3  Medication Adjustments/Labs and Tests Ordered: Current medicines are reviewed at length with the patient today.  Concerns regarding medicines are outlined above.  Orders Placed This Encounter  Procedures   MYOCARDIAL PERFUSION IMAGING   No orders of the defined types were placed in this encounter.   Signed, Nani Ingram reddy Jocilynn Grade, MD, MPH, [facility]. 09/11/2024 4:27 PM    [facility]      [1]  Current Meds  Medication Sig   acetaminophen  (TYLENOL ) 650 MG CR tablet Take 650 mg by mouth every 8 (eight) hours as needed for pain.   Calcium  Carbonate-Vit D-Min (CALCIUM  1200 PO) Take 1 tablet by mouth daily.   diltiazem  (CARDIZEM ) 30 MG tablet Take 1 tablet (30 mg total) by mouth every 8 (eight) hours as needed (for heart rate greater than 110 BPM).   ELIQUIS  5 MG TABS tablet TAKE 1 TABLET BY MOUTH TWICE A DAY   furosemide  (LASIX ) 20 MG tablet Take 20 mg by mouth daily as needed for edema (weight  gain of 3 pds in one night).   gabapentin (NEURONTIN) 100 MG capsule Take 100 mg by mouth at bedtime.   levothyroxine (SYNTHROID) 75 MCG tablet Take 75 mcg by mouth every morning.   metoprolol  succinate (TOPROL -XL) 25 MG 24 hr tablet TAKE 1 TABLET (25 MG TOTAL) BY MOUTH DAILY.   Multiple Vitamins-Minerals (MACULAR HEALTH FORMULA PO) Take 1 tablet by mouth 2 (two) times daily.    omeprazole  (PRILOSEC) 20 MG capsule Take 1 capsule (20 mg total) by mouth daily.   rosuvastatin  (CRESTOR ) 10 MG tablet TAKE 1 TABLET BY MOUTH EVERY DAY   tamsulosin (FLOMAX) 0.4 MG CAPS capsule Take 0.4 mg by mouth daily.   vitamin C (ASCORBIC ACID) 500 MG tablet Take 1,000 mg by mouth daily.   [DISCONTINUED] levothyroxine (SYNTHROID) 50 MCG tablet Take 50 mcg by mouth daily.   "

## 2024-09-11 NOTE — Assessment & Plan Note (Signed)
 Euvolemic and compensated. Symptoms well-controlled. Continue salt restriction below 2 g/day. Continue Lasix  on an as-needed basis.  Has not required to take any doses in several months.  Continue with metoprolol  succinate 25 mg once daily. Will hold off on escalating any further therapy at this time

## 2024-09-11 NOTE — Assessment & Plan Note (Signed)
 Will consider follow-up echocardiogram tentatively at next visit in 6 months.

## 2024-09-11 NOTE — Assessment & Plan Note (Signed)
 Cardiac CT November 2024 with CAD RADS 2 study, calcium  score 146 and total plaque volume to 50 mm cube.  Now with symptoms of progressive dyspnea on exertion walking up an incline over the past year.  Will proceed with Lexiscan stress test with nuclear imaging to rule out any significant ischemia burden.  Continue with his Eliquis  as current. Continue with rosuvastatin  10 mg once daily.  Advised him to reach out to our office or call 911 or go to the ER if his symptoms acutely worsen or has symptoms of chest pain or shortness of breath not relieving quickly with rest.

## 2024-09-20 ENCOUNTER — Ambulatory Visit: Admitting: Podiatry

## 2024-09-20 DIAGNOSIS — B351 Tinea unguium: Secondary | ICD-10-CM

## 2024-09-20 DIAGNOSIS — M79675 Pain in left toe(s): Secondary | ICD-10-CM | POA: Diagnosis not present

## 2024-09-20 DIAGNOSIS — M79674 Pain in right toe(s): Secondary | ICD-10-CM

## 2024-09-20 DIAGNOSIS — I739 Peripheral vascular disease, unspecified: Secondary | ICD-10-CM | POA: Diagnosis not present

## 2024-09-20 DIAGNOSIS — L84 Corns and callosities: Secondary | ICD-10-CM

## 2024-09-20 NOTE — Progress Notes (Signed)
"   °  °  Subjective:  Patient ID: Terry Villanueva, male    DOB: 15-Feb-1936,  MRN: [id number]  Terry Villanueva presents to clinic today for:  Chief Complaint  Patient presents with   [facility]    Not diabetic, no callous, takes Eliquis .    Patient notes nails are thick and elongated, causing pain in shoe gear when ambulating.  He has painful calluses bilateral submet 5  PCP is Conley Dene BROCKS., MD.  Last seen 02/10/2024  Past Medical History:  Diagnosis Date   (HFpEF) heart failure with preserved ejection fraction (HCC) 03/09/2024   Arthritis 02/21/2018   CAD (coronary artery disease) 03/09/2024   Carpal tunnel syndrome of left wrist 08/28/2014   Cervical radiculopathy 08/16/2014   Erectile dysfunction due to arterial insufficiency 07/24/2015   Esophageal dysphagia    Essential hypertension 06/07/2017   GERD (gastroesophageal reflux disease) 02/21/2018   Heart disease 07/17/2021   Hypertensive heart disease 06/07/2017   Hypothyroidism 02/21/2018   Impingement syndrome of left shoulder region 09/20/2018   Inflammatory pain 11/05/2022   Kidney stone 01/2022   Long term current use of anticoagulant 12/19/2016   Mild mitral regurgitation 03/09/2024   PAF (paroxysmal atrial fibrillation) (HCC) 06/07/2017   Pain in right hand 11/05/2022   Polio    1950   Prostate nodule 07/24/2015   Sciatica    No Known Allergies  Objective:  Terry Villanueva is a pleasant 89 y.o. male in NAD. AAO x 3.  Vascular Examination: Patient has palpable DP pulse, absent PT pulse bilateral.  Delayed capillary refill bilateral toes.  Sparse digital hair bilateral.  Proximal to distal cooling WNL bilateral.    Dermatological Examination: Interspaces are clear with no open lesions noted bilateral.  Skin is shiny and atrophic bilateral.  Nails are 3-58mm thick, with yellowish/brown discoloration, subungual debris and distal onycholysis x10.  There is pain with compression of nails x10.  There are hyperkeratotic lesions  noted bilateral submet 5.  Patient qualifies for at-risk foot care because of PVD.  Assessment/Plan: 1. Pain due to onychomycosis of toenails of both feet   2. Callus of foot   3. PVD (peripheral vascular disease)    Mycotic nails x10 were sharply debrided with sterile nail nippers and power debriding burr to decrease bulk and length.  Hyperkeratotic lesions bilateral submet 5 were shaved with #312 blade.  Follow-up in 3 months   Terry Villanueva, DPM, FACFAS [facility]     [address] [place], [place] [address]                Office [phone]  Fax [phone] "

## 2024-09-22 ENCOUNTER — Ambulatory Visit: Admitting: Gastroenterology

## 2024-09-26 ENCOUNTER — Telehealth (HOSPITAL_COMMUNITY): Payer: Self-pay

## 2024-09-26 NOTE — Telephone Encounter (Signed)
 Spoke to the patient, he stated that he would be here for his test. S.Lynzi Meulemans CCT

## 2024-10-04 ENCOUNTER — Ambulatory Visit

## 2024-10-04 DIAGNOSIS — I2510 Atherosclerotic heart disease of native coronary artery without angina pectoris: Secondary | ICD-10-CM | POA: Diagnosis not present

## 2024-10-04 MED ORDER — REGADENOSON 0.4 MG/5ML IV SOLN
0.4000 mg | Freq: Once | INTRAVENOUS | Status: AC
  Administered 2024-10-04: 0.4 mg via INTRAVENOUS

## 2024-10-04 MED ORDER — TECHNETIUM TC 99M TETROFOSMIN IV KIT
10.2000 | PACK | Freq: Once | INTRAVENOUS | Status: AC | PRN
  Administered 2024-10-04: 10.2 via INTRAVENOUS

## 2024-10-04 MED ORDER — TECHNETIUM TC 99M TETROFOSMIN IV KIT
29.2000 | PACK | Freq: Once | INTRAVENOUS | Status: AC | PRN
  Administered 2024-10-04: 29.2 via INTRAVENOUS

## 2024-10-06 ENCOUNTER — Ambulatory Visit: Payer: Self-pay

## 2024-10-06 DIAGNOSIS — I2510 Atherosclerotic heart disease of native coronary artery without angina pectoris: Secondary | ICD-10-CM

## 2024-10-06 LAB — MYOCARDIAL PERFUSION IMAGING
LV dias vol: 131 mL (ref 62–150)
LV sys vol: 57 mL
Nuc Stress EF: 56 %
Peak HR: 82 {beats}/min
Rest HR: 64 {beats}/min
Rest Nuclear Isotope Dose: 10.2 mCi
SDS: 2
SRS: 4
SSS: 6
ST Depression (mm): 0 mm
Stress Nuclear Isotope Dose: 29.2 mCi
TID: 1.05

## 2024-10-10 MED ORDER — ISOSORBIDE MONONITRATE ER 30 MG PO TB24: 30.0000 mg | ORAL_TABLET | Freq: Every day | ORAL | 3 refills | Status: AC

## 2024-10-24 ENCOUNTER — Ambulatory Visit: Admitting: Gastroenterology

## 2024-11-08 ENCOUNTER — Ambulatory Visit

## 2024-11-15 ENCOUNTER — Ambulatory Visit

## 2024-11-22 ENCOUNTER — Ambulatory Visit: Admitting: Podiatry
